# Patient Record
Sex: Female | Born: 1970 | Race: Black or African American | Hispanic: No | Marital: Single | Smoking: Former smoker
Health system: Southern US, Community
[De-identification: ages and names within clinical notes are randomized; demographics above are authoritative.]

## PROBLEM LIST (undated history)

## (undated) HISTORY — PX: TUBAL LIGATION: SHX77

---

## 2005-05-31 ENCOUNTER — Emergency Department: Payer: Self-pay | Admitting: Emergency Medicine

## 2012-08-13 ENCOUNTER — Emergency Department: Payer: Self-pay | Admitting: Emergency Medicine

## 2012-11-03 ENCOUNTER — Emergency Department: Payer: Self-pay | Admitting: Emergency Medicine

## 2012-12-26 ENCOUNTER — Emergency Department: Payer: Self-pay | Admitting: Unknown Physician Specialty

## 2013-05-09 ENCOUNTER — Emergency Department: Payer: Self-pay | Admitting: Emergency Medicine

## 2013-05-11 LAB — BETA STREP CULTURE(ARMC)

## 2013-11-25 ENCOUNTER — Emergency Department: Payer: Self-pay | Admitting: Emergency Medicine

## 2014-06-18 ENCOUNTER — Emergency Department: Payer: Self-pay | Admitting: Emergency Medicine

## 2016-01-02 DIAGNOSIS — Y990 Civilian activity done for income or pay: Secondary | ICD-10-CM | POA: Insufficient documentation

## 2016-01-02 DIAGNOSIS — X501XXA Overexertion from prolonged static or awkward postures, initial encounter: Secondary | ICD-10-CM | POA: Insufficient documentation

## 2016-01-02 DIAGNOSIS — S3992XA Unspecified injury of lower back, initial encounter: Secondary | ICD-10-CM | POA: Insufficient documentation

## 2016-01-02 DIAGNOSIS — S4991XA Unspecified injury of right shoulder and upper arm, initial encounter: Secondary | ICD-10-CM | POA: Insufficient documentation

## 2016-01-02 DIAGNOSIS — F17200 Nicotine dependence, unspecified, uncomplicated: Secondary | ICD-10-CM | POA: Insufficient documentation

## 2016-01-02 DIAGNOSIS — T148 Other injury of unspecified body region: Secondary | ICD-10-CM | POA: Insufficient documentation

## 2016-01-02 DIAGNOSIS — Y9289 Other specified places as the place of occurrence of the external cause: Secondary | ICD-10-CM | POA: Insufficient documentation

## 2016-01-02 DIAGNOSIS — S3991XA Unspecified injury of abdomen, initial encounter: Secondary | ICD-10-CM | POA: Insufficient documentation

## 2016-01-02 DIAGNOSIS — Y93F2 Activity, caregiving, lifting: Secondary | ICD-10-CM | POA: Insufficient documentation

## 2016-01-02 LAB — COMPREHENSIVE METABOLIC PANEL
ALBUMIN: 3.7 g/dL (ref 3.5–5.0)
ALK PHOS: 73 U/L (ref 38–126)
ALT: 27 U/L (ref 14–54)
ANION GAP: 6 (ref 5–15)
AST: 23 U/L (ref 15–41)
BUN: 15 mg/dL (ref 6–20)
CO2: 27 mmol/L (ref 22–32)
Calcium: 8.9 mg/dL (ref 8.9–10.3)
Chloride: 105 mmol/L (ref 101–111)
Creatinine, Ser: 1.04 mg/dL — ABNORMAL HIGH (ref 0.44–1.00)
GFR calc Af Amer: >60 mL/min (ref >60)
GFR calc non Af Amer: >60 mL/min (ref >60)
GLUCOSE: 130 mg/dL — AB (ref 65–99)
POTASSIUM: 3.6 mmol/L (ref 3.5–5.1)
SODIUM: 138 mmol/L (ref 135–145)
Total Bilirubin: 0.5 mg/dL (ref 0.3–1.2)
Total Protein: 7.6 g/dL (ref 6.5–8.1)

## 2016-01-02 LAB — URINALYSIS COMPLETE WITH MICROSCOPIC (ARMC ONLY)
BACTERIA UA: NONE SEEN
Bilirubin Urine: NEGATIVE
Glucose, UA: NEGATIVE mg/dL
Hgb urine dipstick: NEGATIVE
Nitrite: NEGATIVE
PROTEIN: NEGATIVE mg/dL
SPECIFIC GRAVITY, URINE: 1.035 — AB (ref 1.005–1.030)
pH: 5 (ref 5.0–8.0)

## 2016-01-02 LAB — CBC
HEMATOCRIT: 40.5 % (ref 35.0–47.0)
HEMOGLOBIN: 13.4 g/dL (ref 12.0–16.0)
MCH: 29.4 pg (ref 26.0–34.0)
MCHC: 33 g/dL (ref 32.0–36.0)
MCV: 89.1 fL (ref 80.0–100.0)
Platelets: 324 10*3/uL (ref 150–440)
RBC: 4.55 MIL/uL (ref 3.80–5.20)
RDW: 13.9 % (ref 11.5–14.5)
WBC: 11.7 10*3/uL — ABNORMAL HIGH (ref 3.6–11.0)

## 2016-01-02 LAB — LIPASE, BLOOD: Lipase: 22 U/L (ref 11–51)

## 2016-01-02 NOTE — ED Notes (Signed)
Pt to ED c/o right flank, RLQ pain and right arm pain X 1 days. Pt alert and oriented X4, active, cooperative, pt in NAD. RR even and unlabored, color WNL.  Denies NVD

## 2016-01-03 ENCOUNTER — Emergency Department
Admission: EM | Admit: 2016-01-03 | Discharge: 2016-01-03 | Disposition: A | Discharge status: Home / Self Care | Payer: Self-pay | Attending: Emergency Medicine | Admitting: Emergency Medicine

## 2016-01-03 ENCOUNTER — Emergency Department: Payer: Self-pay

## 2016-01-03 DIAGNOSIS — T148XXA Other injury of unspecified body region, initial encounter: Secondary | ICD-10-CM

## 2016-01-03 DIAGNOSIS — R109 Unspecified abdominal pain: Secondary | ICD-10-CM

## 2016-01-03 MED ORDER — IBUPROFEN 800 MG PO TABS: 800.0000 mg | ORAL_TABLET | Freq: Three times a day (TID) | ORAL | Status: DC | PRN

## 2016-01-03 MED ORDER — SODIUM CHLORIDE 0.9 % IV BOLUS (SEPSIS)
1000.0000 mL | Freq: Once | INTRAVENOUS | Status: AC
  Administered 2016-01-03: 1000 mL via INTRAVENOUS

## 2016-01-03 MED ORDER — KETOROLAC TROMETHAMINE 30 MG/ML IJ SOLN
10.0000 mg | Freq: Once | INTRAMUSCULAR | Status: AC
  Administered 2016-01-03: 9.9 mg via INTRAVENOUS
  Filled 2016-01-03: qty 1

## 2016-01-03 MED ORDER — HYDROCODONE-ACETAMINOPHEN 5-325 MG PO TABS: 1.0000 | ORAL_TABLET | Freq: Four times a day (QID) | ORAL | Status: DC | PRN

## 2016-01-03 NOTE — ED Provider Notes (Signed)
[facility] Emergency Department Provider Note  ____________________________________________  Time seen: Approximately 2:22 AM  I have reviewed the triage vital signs and the nursing notes.   HISTORY  Chief Complaint Abdominal Pain; Flank Pain; and Arm Pain    HPI Carolyn Baxter is a 45 y.o. female who presents with the chief complaint of right flank and right lower quadrant abdominal pain. Patient reports onset of symptoms approximately 4 days ago, waxing/waning. 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 No prior history of nephrolithiasis. Denies associated symptoms of fever, chills, nausea, vomiting, dysuria, vaginal discharge or bleeding. Denies recent travel or trauma. Nothing makes her pain better or worse.   Past medical history None  There are no active problems to display for this patient.   History reviewed. No pertinent past surgical history.  No current outpatient prescriptions on file.  Allergies Review of patient's allergies indicates no known allergies.  No family history on file.  Social History Social History  Substance Use Topics  . Smoking status: Current Every Day Smoker  . Smokeless tobacco: None  . Alcohol Use: No    Review of Systems Constitutional: No fever/chills Eyes: No visual changes. ENT: No sore throat. Cardiovascular: Denies chest pain. Respiratory: Denies shortness of breath. Gastrointestinal: Positive for abdominal pain.  No nausea, no vomiting.  No diarrhea.  No constipation. Genitourinary: Negative for dysuria. Musculoskeletal: Positive for back pain. Skin: Negative for rash. Neurological: Negative for headaches, focal weakness or numbness.  10-point ROS otherwise negative.  ____________________________________________   PHYSICAL EXAM:  VITAL SIGNS: ED Triage Vitals  Enc Vitals Group     BP 01/02/16 2254  119/84 mmHg     Pulse Rate 01/02/16 2254 60     Resp 01/02/16 2254 18     Temp 01/02/16 2254 97.6 F (36.4 C)     Temp Source 01/02/16 2254 Oral     SpO2 01/02/16 2254 100 %     Weight 01/02/16 2254 200 lb (90.719 kg)     Height 01/02/16 2254  (1.626 m)     Head Cir --      Peak Flow --      Pain Score 01/02/16 2254 7     Pain Loc --      Pain Edu? --      Excl. in GC? --     Constitutional: Alert and oriented. Well appearing and in no acute distress. Eyes: Conjunctivae are normal. PERRL. EOMI. Head: Atraumatic. Nose: No congestion/rhinnorhea. Mouth/Throat: Mucous membranes are moist.  Oropharynx non-erythematous. Neck: No stridor.   Cardiovascular: Normal rate, regular rhythm. Grossly normal heart sounds.  Good peripheral circulation. Respiratory: Normal respiratory effort.  No retractions. Lungs CTAB. Gastrointestinal: Soft and nontender. No distention. No abdominal bruits. Mild right CVA tenderness. Musculoskeletal: No lower extremity tenderness nor edema.  No joint effusions. Neurologic:  Normal speech and language. No gross focal neurologic deficits are appreciated. No gait instability. Skin:  Skin is warm, dry and intact. No rash noted. Psychiatric: Mood and affect are normal. Speech and behavior are normal.  ____________________________________________   LABS (all labs ordered are listed, but only abnormal results are displayed)  Labs Reviewed  COMPREHENSIVE METABOLIC PANEL - Abnormal; Notable for the following:    Glucose, Bld 130 (*)    Creatinine, Ser 1.04 (*)    All other components within normal limits  CBC - Abnormal; Notable for the following:  WBC 11.7 (*)    All other components within normal limits  URINALYSIS COMPLETEWITH MICROSCOPIC ([facility] ONLY) - Abnormal; Notable for the following:    Color, Urine YELLOW (*)    APPearance HAZY (*)    Ketones, ur TRACE (*)    Specific Gravity, Urine 1.035 (*)    Leukocytes, UA TRACE (*)    Squamous  Epithelial / LPF 0-5 (*)    All other components within normal limits  LIPASE, BLOOD   ____________________________________________  EKG  ED ECG REPORT I, SUNG,JADE J, the attending physician, personally viewed and interpreted this ECG.   Date: 01/03/2016  EKG Time: 2257  Rate: 62  Rhythm: normal EKG, normal sinus rhythm  Axis: Normal  Intervals:none  ST&T Change: Nonspecific  ____________________________________________  RADIOLOGY  CT renal stone study interpreted per Dr. Andria Meuse: No renal or ureteral stone or obstruction. No acute process demonstrated in the abdomen or pelvis on noncontrast imaging. ____________________________________________   PROCEDURES  Procedure(s) performed: None  Critical Care performed: No  ____________________________________________   INITIAL IMPRESSION / ASSESSMENT AND PLAN / ED COURSE  Pertinent labs & imaging results that were available during my care of the patient were reviewed by me and considered in my medical decision making (see chart for details).  45 year old female who presents with right flank to right lower quadrant abdominal pain. Laboratory and urinalysis results are remarkable except for 0-5 RBC noted in urine. Will initiate IV fluid resuscitation and obtain CT renal colic study to evaluate for nephrolithiasis.  ----------------------------------------- 3:24 AM on 01/03/2016 -----------------------------------------  Patient resting in no acute distress. Updated patient of CT imaging results. She drove herself to the ED so we will administer a dose of Toradol prior to discharge. Symptoms are likely musculoskeletal in nature given patient's line of work. Strict return precautions given. Patient verbalizes understanding and agrees with plan of care.  ____________________________________________   FINAL CLINICAL IMPRESSION(S) / ED DIAGNOSES  Final diagnoses:  Right flank pain  Musculoskeletal strain      Irean Hong, MD 01/03/16 [phone]

## 2016-01-03 NOTE — Discharge Instructions (Signed)
1.  Take pain medicines as needed (Motrin/Norco #15). °2.  Return to the ER for worsening symptoms, persistent vomiting, difficulty breathing or other concerns. °

## 2016-01-03 NOTE — ED Notes (Signed)
Patient transported to CT 

## 2016-09-27 ENCOUNTER — Emergency Department
Admission: EM | Admit: 2016-09-27 | Discharge: 2016-09-27 | Disposition: A | Discharge status: Home / Self Care | Payer: Self-pay | Attending: Emergency Medicine | Admitting: Emergency Medicine

## 2016-09-27 DIAGNOSIS — G43809 Other migraine, not intractable, without status migrainosus: Secondary | ICD-10-CM | POA: Insufficient documentation

## 2016-09-27 DIAGNOSIS — J0110 Acute frontal sinusitis, unspecified: Secondary | ICD-10-CM | POA: Insufficient documentation

## 2016-09-27 DIAGNOSIS — Z79899 Other long term (current) drug therapy: Secondary | ICD-10-CM | POA: Insufficient documentation

## 2016-09-27 DIAGNOSIS — F17200 Nicotine dependence, unspecified, uncomplicated: Secondary | ICD-10-CM | POA: Insufficient documentation

## 2016-09-27 LAB — CBC
HEMATOCRIT: 43.3 % (ref 35.0–47.0)
HEMOGLOBIN: 14.1 g/dL (ref 12.0–16.0)
MCH: 29.3 pg (ref 26.0–34.0)
MCHC: 32.7 g/dL (ref 32.0–36.0)
MCV: 89.7 fL (ref 80.0–100.0)
Platelets: 370 10*3/uL (ref 150–440)
RBC: 4.83 MIL/uL (ref 3.80–5.20)
RDW: 14.3 % (ref 11.5–14.5)
WBC: 11.6 10*3/uL — AB (ref 3.6–11.0)

## 2016-09-27 LAB — BASIC METABOLIC PANEL
ANION GAP: 4 — AB (ref 5–15)
BUN: 13 mg/dL (ref 6–20)
CHLORIDE: 108 mmol/L (ref 101–111)
CO2: 26 mmol/L (ref 22–32)
CREATININE: 1.12 mg/dL — AB (ref 0.44–1.00)
Calcium: 8.4 mg/dL — ABNORMAL LOW (ref 8.9–10.3)
GFR calc non Af Amer: 58 mL/min — ABNORMAL LOW (ref >60)
Glucose, Bld: 124 mg/dL — ABNORMAL HIGH (ref 65–99)
POTASSIUM: 4.5 mmol/L (ref 3.5–5.1)
SODIUM: 138 mmol/L (ref 135–145)

## 2016-09-27 MED ORDER — SODIUM CHLORIDE 0.9 % IV BOLUS (SEPSIS)
1000.0000 mL | Freq: Once | INTRAVENOUS | Status: AC
  Administered 2016-09-27: 1000 mL via INTRAVENOUS

## 2016-09-27 MED ORDER — AMOXICILLIN 500 MG PO CAPS: 500.0000 mg | ORAL_CAPSULE | Freq: Three times a day (TID) | ORAL | 0 refills | Status: AC

## 2016-09-27 MED ORDER — KETOROLAC TROMETHAMINE 30 MG/ML IJ SOLN
30.0000 mg | Freq: Once | INTRAMUSCULAR | Status: AC
  Administered 2016-09-27: 30 mg via INTRAVENOUS
  Filled 2016-09-27: qty 1

## 2016-09-27 MED ORDER — METOCLOPRAMIDE HCL 5 MG/ML IJ SOLN
10.0000 mg | Freq: Once | INTRAMUSCULAR | Status: AC
  Administered 2016-09-27: 10 mg via INTRAVENOUS
  Filled 2016-09-27: qty 2

## 2016-09-27 MED ORDER — AMOXICILLIN 500 MG PO CAPS
500.0000 mg | ORAL_CAPSULE | Freq: Once | ORAL | Status: AC
  Administered 2016-09-27: 500 mg via ORAL
  Filled 2016-09-27: qty 1

## 2016-09-27 MED ORDER — DIPHENHYDRAMINE HCL 50 MG/ML IJ SOLN
50.0000 mg | Freq: Once | INTRAMUSCULAR | Status: AC
  Administered 2016-09-27: 50 mg via INTRAVENOUS
  Filled 2016-09-27: qty 1

## 2016-09-27 MED ORDER — IBUPROFEN 800 MG PO TABS: 800.0000 mg | ORAL_TABLET | Freq: Three times a day (TID) | ORAL | 0 refills | Status: DC | PRN

## 2016-09-27 MED ORDER — ONDANSETRON HCL 4 MG/2ML IJ SOLN: 4.0000 mg | Freq: Once | INTRAMUSCULAR | Status: DC | PRN

## 2016-09-27 MED ORDER — OXYMETAZOLINE HCL 0.05 % NA SOLN: 2.0000 | Freq: Two times a day (BID) | NASAL | 0 refills | Status: AC

## 2016-09-27 NOTE — ED Provider Notes (Signed)
[facility] Emergency Department Provider Note  ____________________________________________  Time seen: Approximately 9:58 PM  I have reviewed the triage vital signs and the nursing notes.   HISTORY  Chief Complaint Emesis and Headache   HPI Carolyn Baxter is a 45 y.o. female a history of migraine headaches who presents for evaluation of a headache. Patient reports 2 days of congestion and frontal headache. She describes her headache as throbbing, initially mild that has progressed to severe over the course of the last 2 days. The headache is currently 10 out of 10. She also reports that she started having nausea and vomiting and blurry vision today. She reports that these symptoms are similar to prior episode of sinusitis. Patient reports used to have a history of migraines when she was younger however hasn't had one in a very long time. She endorses phonophobia and photophobia. Patient denies weakness or numbness, sudden onset HA or HA with maximal intensity at onset, fever, neck stiffness, history of immunosuppression, Jaw claudication, muscle aches, temporal artery pain, history of other household members with similar symptoms, pregnancy, clotting disorder, trauma, eye pain, recent cervical manipulation. No personal or family history of aneurysms, blood clots, patient is not on any exogenous hormones.   History reviewed. No pertinent past medical history.  There are no active problems to display for this patient.   Past Surgical History:  Procedure Laterality Date  . TUBAL LIGATION      Prior to Admission medications   Medication Sig Start Date End Date Taking? Authorizing Provider  amoxicillin (AMOXIL) 500 MG capsule Take 1 capsule (500 mg total) by mouth 3 (three) times daily. 09/27/16 10/04/16  Nita Sicklearolina Martine Bleecker, MD  HYDROcodone-acetaminophen (NORCO) 5-325 MG tablet Take 1 tablet by mouth every 6 (six) hours as needed for moderate pain. 01/03/16    Irean HongJade J Sung, MD  ibuprofen (ADVIL,MOTRIN) 800 MG tablet Take 1 tablet (800 mg total) by mouth every 8 (eight) hours as needed. 09/27/16   Nita Sicklearolina Wolf Boulay, MD  oxymetazoline (AFRIN) 0.05 % nasal spray Place 2 sprays into both nostrils 2 (two) times daily. 09/27/16 09/27/17  Nita Sicklearolina Calan Doren, MD    Allergies Patient has no known allergies.  No family history on file.  Social History Social History  Substance Use Topics  . Smoking status: Current Every Day Smoker  . Smokeless tobacco: Never Used  . Alcohol use No    Review of Systems  Constitutional: Negative for fever. Eyes: Negative for visual changes. ENT: Negative for sore throat. + congestion and frontal HA Cardiovascular: Negative for chest pain. Respiratory: Negative for shortness of breath. Gastrointestinal: Negative for abdominal pain,diarrhea. + N/V Genitourinary: Negative for dysuria. Musculoskeletal: Negative for back pain. Skin: Negative for rash. Neurological: Negative for weakness or numbness.  ____________________________________________   PHYSICAL EXAM:  VITAL SIGNS: ED Triage Vitals  Enc Vitals Group     BP 09/27/16 2119 133/84     Pulse Rate 09/27/16 2119 69     Resp 09/27/16 2119 20     Temp 09/27/16 2119 98.2 F (36.8 C)     Temp Source 09/27/16 2119 Oral     SpO2 09/27/16 2119 98 %     Weight 09/27/16 2119 190 lb (86.2 kg)     Height 09/27/16 2119 5\' 4"  (1.626 m)     Head Circumference --      Peak Flow --      Pain Score 09/27/16 2120 10     Pain Loc --  Pain Edu? --      Excl. in GC? --     Constitutional: Alert and oriented. Well appearing and in no apparent distress. HEENT:      Head: Normocephalic and atraumatic. TTP over the R frontal sinus        Eyes: Conjunctivae are normal. Sclera is non-icteric. EOMI. PERRL      Mouth/Throat: Mucous membranes are moist.       Neck: Supple with no signs of meningismus. Negative jolting accentuating maneuver Cardiovascular: Regular rate  and rhythm. No murmurs, gallops, or rubs. 2+ symmetrical distal pulses are present in all extremities. No JVD. Respiratory: Normal respiratory effort. Lungs are clear to auscultation bilaterally. No wheezes, crackles, or rhonchi.  Gastrointestinal: Soft, non tender, and non distended with positive bowel sounds. No rebound or guarding. Musculoskeletal: Nontender with normal range of motion in all extremities. No edema, cyanosis, or erythema of extremities. Neurologic: Normal speech and language. A & O x3, PERRL, no nystagmus, CN II-XII intact, motor testing reveals good tone and bulk throughout. There is no evidence of pronator drift or dysmetria. Muscle strength is 5/5 throughout. Deep tendon reflexes are 2+ throughout with downgoing toes. Sensory examination is intact. Gait is normal. Skin: No rash or petechiae Psychiatric: Mood and affect are normal. Speech and behavior are normal.  ____________________________________________   LABS (all labs ordered are listed, but only abnormal results are displayed)  Labs Reviewed  CBC - Abnormal; Notable for the following:       Result Value   WBC 11.6 (*)    All other components within normal limits  BASIC METABOLIC PANEL - Abnormal; Notable for the following:    Glucose, Bld 124 (*)    Creatinine, Ser 1.12 (*)    Calcium 8.4 (*)    GFR calc non Af Amer 58 (*)    Anion gap 4 (*)    All other components within normal limits  URINALYSIS COMPLETEWITH MICROSCOPIC ([facility] ONLY)   ____________________________________________  EKG  ED ECG REPORT I, Nita Sicklearolina Trinidy Masterson, the attending physician, personally viewed and interpreted this ECG.  Normal sinus rhythm, rate of 69, normal intervals, normal axis, no ST elevations or depressions. Normal EKG ____________________________________________  RADIOLOGY  none ____________________________________________   PROCEDURES  Procedure(s) performed: None Procedures Critical Care performed:   None ____________________________________________   INITIAL IMPRESSION / ASSESSMENT AND PLAN / ED COURSE  45 year old female with remote history of migraine headaches who presents for evaluation of frontal throbbing progressively worsening headache over the course of the last 2 days associated with congestion, tenderness to palpation of the frontal sinus, photophobia, and phonophobia. Presentation concerning for sinusitis versus migraines. Patient is completely neurologically intact. Low suspicion for more serious or life threatening etiology of HA based on history and exam. No sudden onset thunderclap HA, onset with exertion, vomiting, focal neurologic deficits, to suggest increased risk of subarachnoid hemorrhage. No fever, neck pain, neck stiffness, or meningismus on exam to suggest meningitis. No fevers, altered mental status, unusual behavior to suggest encephalitis. No focal neurologic deficits by history or exam to suggest central venous thrombosis. No constitutional symptoms including fever, fatigue, weight loss, temporal scalp tenderness, jaw claudication, visual loss, to suggest temporal arteritis. No immunocompromise to suggest increased risk for intracranial infectious disease. No visual changes or findings on ocular exam to suggest acute angle closure glaucoma. No reports of toxic exposures including carbon monoxide or other household members with similar symptoms.  Plan for IV fluids, IV Benadryl, IV Reglan, IV Toradol. We'll check  pregnancy test. Will aslo start patient on amoxicillin for sinusitis.   Clinical Course as of Sep 27 2317  Wynelle Link Sep 27, 2016  2316 Patient's headache has fully resolved. She remains neurologically intact with normal exam. We'll discharge home with supportive care and amoxicillin for sinusitis.  [CV]    Clinical Course User Index [CV] Nita Sickle, MD    Pertinent labs & imaging results that were available during my care of the patient were reviewed by  me and considered in my medical decision making (see chart for details).    ____________________________________________   FINAL CLINICAL IMPRESSION(S) / ED DIAGNOSES  Final diagnoses:  Other migraine without status migrainosus, not intractable  Acute frontal sinusitis, recurrence not specified      NEW MEDICATIONS STARTED DURING THIS VISIT:  New Prescriptions   AMOXICILLIN (AMOXIL) 500 MG CAPSULE    Take 1 capsule (500 mg total) by mouth 3 (three) times daily.   IBUPROFEN (ADVIL,MOTRIN) 800 MG TABLET    Take 1 tablet (800 mg total) by mouth every 8 (eight) hours as needed.   OXYMETAZOLINE (AFRIN) 0.05 % NASAL SPRAY    Place 2 sprays into both nostrils 2 (two) times daily.     Note:  This document was prepared using Dragon voice recognition software and may include unintentional dictation errors.    Nita Sickle, MD 09/27/16 [phone]

## 2016-09-27 NOTE — ED Notes (Signed)
Pt discharged to home.  Family member driving.  Discharge instructions reviewed.  Verbalized understanding.  No questions or concerns at this time.  Teach back verified.  Pt in NAD.  No items left in ED.   

## 2016-09-27 NOTE — ED Triage Notes (Signed)
Pt has had headache, dizziness and some blurred vision x 2 days.  Pt also reports one episode of vomiting approx 1 hour prior to arrival.  Pt states that the last time she had these symptoms she had norovirus.  Pt states she took an excedrin this morning with no relief.

## 2016-11-23 DIAGNOSIS — J40 Bronchitis, not specified as acute or chronic: Secondary | ICD-10-CM | POA: Insufficient documentation

## 2016-11-23 DIAGNOSIS — F17200 Nicotine dependence, unspecified, uncomplicated: Secondary | ICD-10-CM | POA: Insufficient documentation

## 2016-11-24 ENCOUNTER — Encounter: Payer: Self-pay | Admitting: Emergency Medicine

## 2016-11-24 ENCOUNTER — Emergency Department
Admission: EM | Admit: 2016-11-24 | Discharge: 2016-11-24 | Disposition: A | Discharge status: Home / Self Care | Payer: Self-pay | Attending: Emergency Medicine | Admitting: Emergency Medicine

## 2016-11-24 ENCOUNTER — Emergency Department: Payer: Self-pay

## 2016-11-24 DIAGNOSIS — J40 Bronchitis, not specified as acute or chronic: Secondary | ICD-10-CM

## 2016-11-24 DIAGNOSIS — R05 Cough: Secondary | ICD-10-CM

## 2016-11-24 DIAGNOSIS — R059 Cough, unspecified: Secondary | ICD-10-CM

## 2016-11-24 LAB — BASIC METABOLIC PANEL
Anion gap: 6 (ref 5–15)
BUN: 7 mg/dL (ref 6–20)
CALCIUM: 8.5 mg/dL — AB (ref 8.9–10.3)
CO2: 24 mmol/L (ref 22–32)
CREATININE: 0.89 mg/dL (ref 0.44–1.00)
Chloride: 107 mmol/L (ref 101–111)
Glucose, Bld: 102 mg/dL — ABNORMAL HIGH (ref 65–99)
Potassium: 3.7 mmol/L (ref 3.5–5.1)
SODIUM: 137 mmol/L (ref 135–145)

## 2016-11-24 LAB — CBC
HCT: 40.5 % (ref 35.0–47.0)
HEMOGLOBIN: 13.5 g/dL (ref 12.0–16.0)
MCH: 30.1 pg (ref 26.0–34.0)
MCHC: 33.4 g/dL (ref 32.0–36.0)
MCV: 90.1 fL (ref 80.0–100.0)
PLATELETS: 264 10*3/uL (ref 150–440)
RBC: 4.5 MIL/uL (ref 3.80–5.20)
RDW: 13.9 % (ref 11.5–14.5)
WBC: 6 10*3/uL (ref 3.6–11.0)

## 2016-11-24 LAB — LIPASE, BLOOD: LIPASE: 20 U/L (ref 11–51)

## 2016-11-24 LAB — TROPONIN I

## 2016-11-24 MED ORDER — KETOROLAC TROMETHAMINE 60 MG/2ML IM SOLN
60.0000 mg | Freq: Once | INTRAMUSCULAR | Status: AC
  Administered 2016-11-24: 60 mg via INTRAMUSCULAR
  Filled 2016-11-24: qty 2

## 2016-11-24 MED ORDER — PREDNISONE 20 MG PO TABS: 60.0000 mg | ORAL_TABLET | Freq: Every day | ORAL | 0 refills | Status: DC

## 2016-11-24 MED ORDER — BENZONATATE 100 MG PO CAPS: 100.0000 mg | ORAL_CAPSULE | Freq: Four times a day (QID) | ORAL | 0 refills | Status: DC | PRN

## 2016-11-24 MED ORDER — BENZONATATE 100 MG PO CAPS
100.0000 mg | ORAL_CAPSULE | Freq: Once | ORAL | Status: AC
  Administered 2016-11-24: 100 mg via ORAL
  Filled 2016-11-24: qty 1

## 2016-11-24 NOTE — ED Triage Notes (Signed)
Patient ambulatory to triage with steady gait, without difficulty or distress noted; pt reports nonprod cough x 2 days with no accomp symptoms; taking OTC meds without relief

## 2016-11-24 NOTE — ED Provider Notes (Signed)
[facility] Emergency Department Provider Note   ____________________________________________   First MD Initiated Contact with Patient 11/24/16 0451     (approximate)  I have reviewed the triage vital signs and the nursing notes.   HISTORY  Chief Complaint Cough    HPI Carolyn Baxter is a 46 y.o. female who comes into the hospital today with a bad cough and running a fever. The patient reports that she's been coughing so much that she's been urinating on herself. She has some chest pain and soreness in her arms and joints. The patient also reports that she's coughed so much that she has a headache. The patient reports that this started around January 6 and she has been taking TheraFlu and Tylenol and Aleve. She has had some sick contacts at work. The patient reports that she had a temperature at home to a max of 101. She reports that she's been sleeping a lot at home as well. She denies any nausea or vomiting but has some 10 out of 10 pain she reports in her chest as well as down her back worse when she is coughing. The patient decided to come into the hospital today for evaluation.   History reviewed. No pertinent past medical history.  There are no active problems to display for this patient.   Past Surgical History:  Procedure Laterality Date  . TUBAL LIGATION      Prior to Admission medications   Medication Sig Start Date End Date Taking? Authorizing Provider  benzonatate (TESSALON PERLES) 100 MG capsule Take 1 capsule (100 mg total) by mouth every 6 (six) hours as needed for cough. 11/24/16   Rebecka Apley, MD  HYDROcodone-acetaminophen (NORCO) 5-325 MG tablet Take 1 tablet by mouth every 6 (six) hours as needed for moderate pain. 01/03/16   Irean Hong, MD  ibuprofen (ADVIL,MOTRIN) 800 MG tablet Take 1 tablet (800 mg total) by mouth every 8 (eight) hours as needed. 09/27/16   Nita Sickle, MD  oxymetazoline (AFRIN) 0.05 % nasal spray Place  2 sprays into both nostrils 2 (two) times daily. 09/27/16 09/27/17  Nita Sickle, MD  predniSONE (DELTASONE) 20 MG tablet Take 3 tablets (60 mg total) by mouth daily. 11/24/16   Rebecka Apley, MD    Allergies Patient has no known allergies.  No family history on file.  Social History Social History  Substance Use Topics  . Smoking status: Current Every Day Smoker  . Smokeless tobacco: Never Used  . Alcohol use No    Review of Systems Constitutional:  fever/chills Eyes: No visual changes. ENT: No sore throat. Cardiovascular:  chest pain. Respiratory: Cough Gastrointestinal: No abdominal pain.  No nausea, no vomiting.  No diarrhea.  No constipation. Genitourinary: Negative for dysuria. Musculoskeletal: Negative for back pain. Skin: Negative for rash. Neurological: Headache.  10-point ROS otherwise negative.  ____________________________________________   PHYSICAL EXAM:  VITAL SIGNS: ED Triage Vitals  Enc Vitals Group     BP 11/24/16 0001 139/79     Pulse Rate 11/24/16 0001 92     Resp 11/24/16 0001 16     Temp 11/24/16 0001 99.6 F (37.6 C)     Temp Source 11/24/16 0001 Oral     SpO2 11/24/16 0001 93 %     Weight 11/24/16 0002 170 lb (77.1 kg)     Height 11/24/16 0002 5\' 3"  (1.6 m)     Head Circumference --      Peak Flow --  Pain Score 11/24/16 0514 8     Pain Loc --      Pain Edu? --      Excl. in GC? --     Constitutional: Alert and oriented. Well appearing and in Mild distress. Eyes: Conjunctivae are normal. PERRL. EOMI. Head: Atraumatic. Nose: No congestion/rhinnorhea. Mouth/Throat: Mucous membranes are moist.  Oropharynx non-erythematous. Cardiovascular: Normal rate, regular rhythm. Grossly normal heart sounds.  Good peripheral circulation. Respiratory: Normal respiratory effort.  No retractions. Lungs CTAB. Gastrointestinal: Soft and nontender. No distention. Positive bowel sounds Musculoskeletal: No lower extremity tenderness nor edema.   Neurologic:  Normal speech and language.  Skin:  Skin is warm, dry and intact. Marland Kitchen Psychiatric: Mood and affect are normal.   ____________________________________________   LABS (all labs ordered are listed, but only abnormal results are displayed)  Labs Reviewed  BASIC METABOLIC PANEL - Abnormal; Notable for the following:       Result Value   Glucose, Bld 102 (*)    Calcium 8.5 (*)    All other components within normal limits  CBC  TROPONIN I  LIPASE, BLOOD   ____________________________________________  EKG  ED ECG REPORT I, Rebecka Apley, the attending physician, personally viewed and interpreted this ECG.   Date: 11/24/2016  EKG Time: 622  Rate: 80  Rhythm: normal sinus rhythm  Axis: normal  Intervals:none  ST&T Change: flipped t waves in lead III, avf, v3 slightly worse than previous  ____________________________________________  RADIOLOGY  CXR ____________________________________________   PROCEDURES  Procedure(s) performed: None  Procedures  Critical Care performed: No  ____________________________________________   INITIAL IMPRESSION / ASSESSMENT AND PLAN / ED COURSE  Pertinent labs & imaging results that were available during my care of the patient were reviewed by me and considered in my medical decision making (see chart for details).  This is a 46 year old female who comes into the hospital today with some cough, chest pain and body aches. The patient reports that she did have a fever at home. I did check some blood work included troponin and a lipase. We also did chest x-ray. All of the patient's results are unremarkable. She does not have any increased work of breathing with this she have any wheezing. I will give the patient a dose of benzonatate and a shot of Toradol in the ER. As the patient's workup is unremarkable and feels the patient can be discharged home. She will be discharged to follow-up with the acute care clinic. The patient  has no further complaints or concerns at this time.  Clinical Course as of Nov 24 842  Tue Nov 24, 2016  0613 Clear lungs DG Chest 2 View [AW]    Clinical Course User Index [AW] Rebecka Apley, MD     ____________________________________________   FINAL CLINICAL IMPRESSION(S) / ED DIAGNOSES  Final diagnoses:  Cough  Bronchitis      NEW MEDICATIONS STARTED DURING THIS VISIT:  Discharge Medication List as of 11/24/2016  6:15 AM    START taking these medications   Details  benzonatate (TESSALON PERLES) 100 MG capsule Take 1 capsule (100 mg total) by mouth every 6 (six) hours as needed for cough., Starting Tue 11/24/2016, Print    predniSONE (DELTASONE) 20 MG tablet Take 3 tablets (60 mg total) by mouth daily., Starting Tue 11/24/2016, Print         Note:  This document was prepared using Dragon voice recognition software and may include unintentional dictation errors.    Revonda Standard P  Zenda AlpersWebster, MD 11/24/16 [phone]

## 2017-02-03 IMAGING — CR DG CHEST 2V
2 series · 2 of 2 positions shown · non-contrast
Comparison: None.

CLINICAL DATA: Cough

EXAM:
CHEST  2 VIEW

[chest pa]
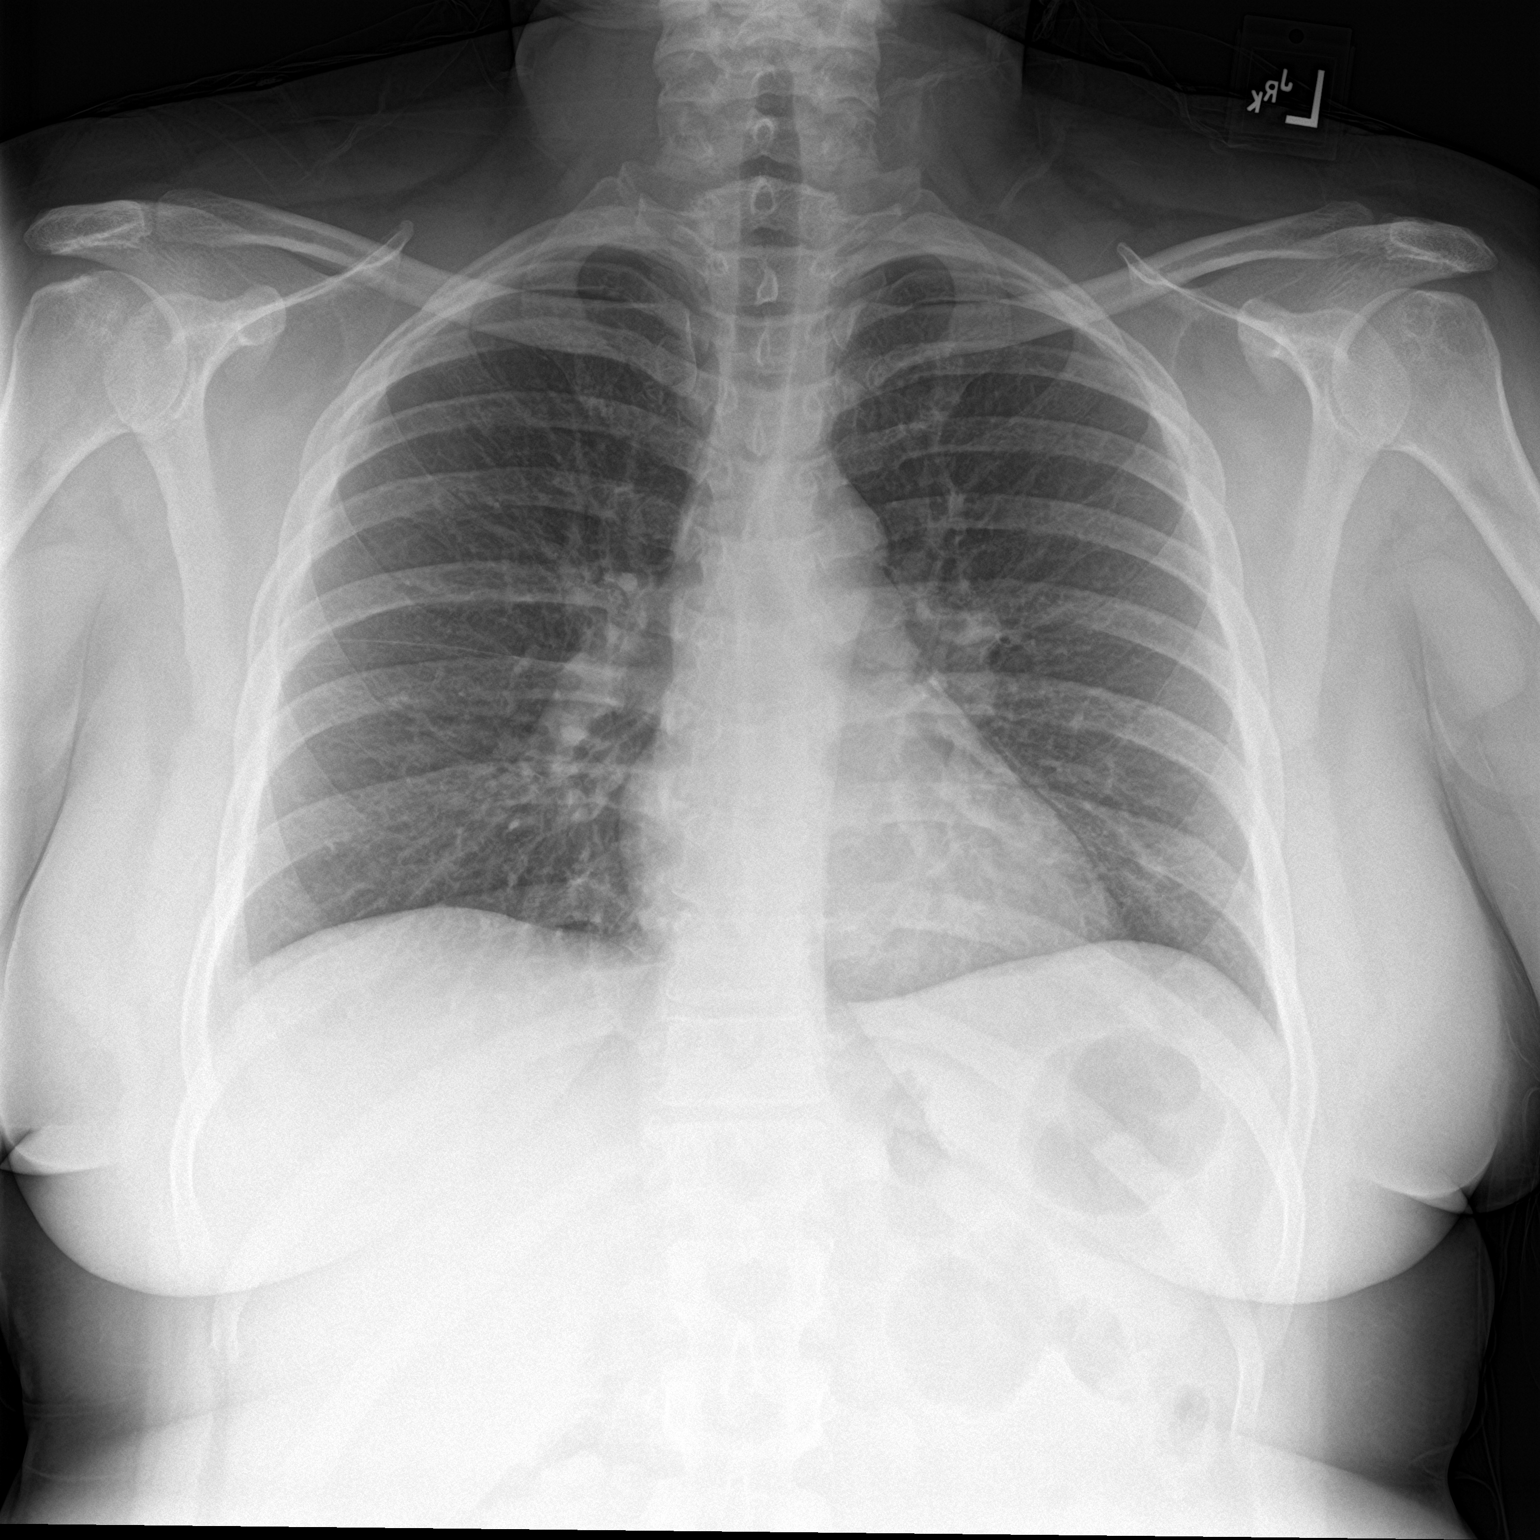

[chest lat]
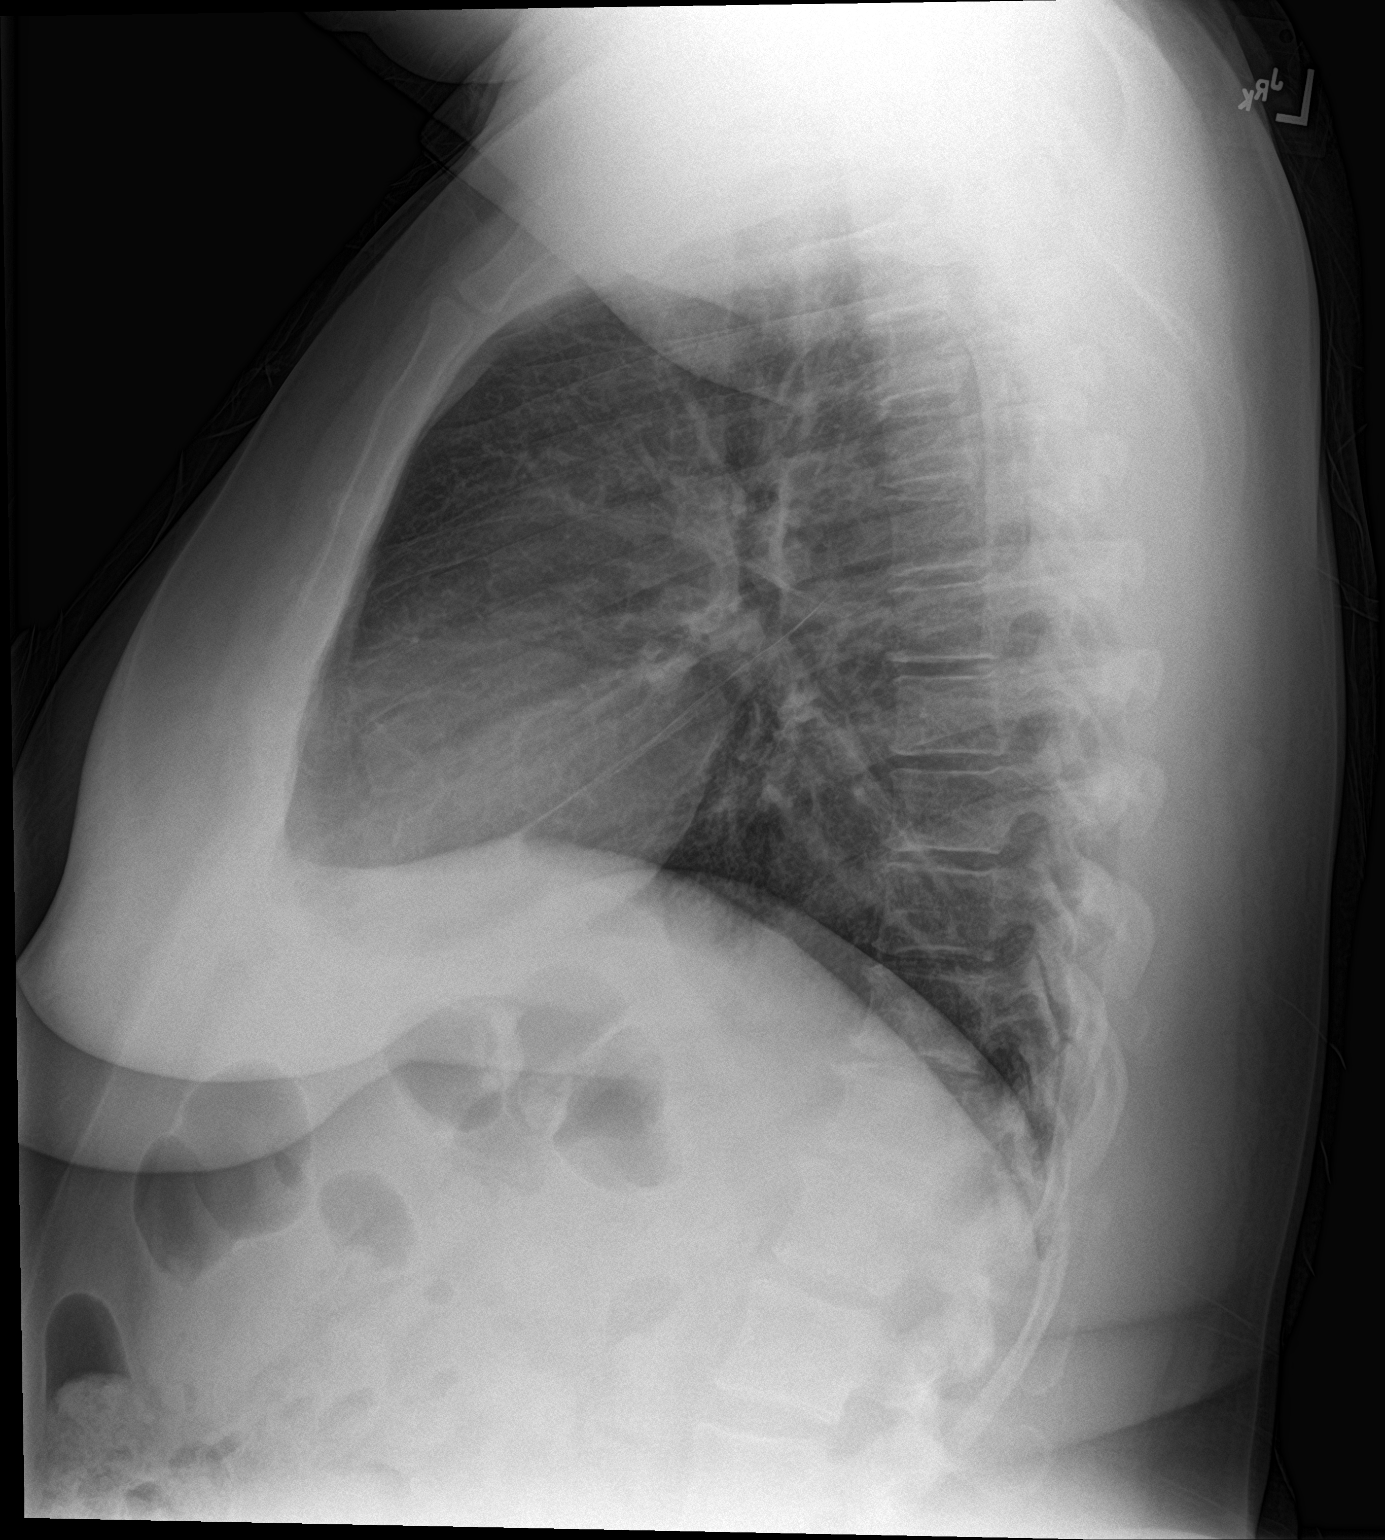

[2 of 2 positions shown; findings below may reference images not displayed]

FINDINGS: Cardiomediastinal contours are normal. No pneumothorax or pleural
effusion. No focal airspace consolidation or pulmonary edema.
IMPRESSION: Clear lungs.

## 2021-03-10 ENCOUNTER — Other Ambulatory Visit: Payer: Self-pay

## 2021-03-10 ENCOUNTER — Emergency Department
Admission: EM | Admit: 2021-03-10 | Discharge: 2021-03-10 | Disposition: A | Discharge status: Home / Self Care | Payer: Self-pay | Attending: Emergency Medicine | Admitting: Emergency Medicine

## 2021-03-10 DIAGNOSIS — F17200 Nicotine dependence, unspecified, uncomplicated: Secondary | ICD-10-CM | POA: Insufficient documentation

## 2021-03-10 DIAGNOSIS — M7542 Impingement syndrome of left shoulder: Secondary | ICD-10-CM | POA: Insufficient documentation

## 2021-03-10 MED ORDER — ACETAMINOPHEN 325 MG PO TABS
650.0000 mg | ORAL_TABLET | Freq: Once | ORAL | Status: AC
  Administered 2021-03-10: 650 mg via ORAL
  Filled 2021-03-10: qty 2

## 2021-03-10 MED ORDER — METHOCARBAMOL 500 MG PO TABS
750.0000 mg | ORAL_TABLET | Freq: Once | ORAL | Status: AC
  Administered 2021-03-10: 750 mg via ORAL
  Filled 2021-03-10: qty 2

## 2021-03-10 MED ORDER — PREDNISONE 10 MG PO TABS: ORAL_TABLET | ORAL | 0 refills | Status: AC

## 2021-03-10 MED ORDER — METHOCARBAMOL 750 MG PO TABS: 750.0000 mg | ORAL_TABLET | Freq: Four times a day (QID) | ORAL | 0 refills | Status: AC | PRN

## 2021-03-10 MED ORDER — PREDNISONE 20 MG PO TABS
60.0000 mg | ORAL_TABLET | Freq: Once | ORAL | Status: AC
  Administered 2021-03-10: 60 mg via ORAL
  Filled 2021-03-10: qty 3

## 2021-03-10 NOTE — ED Triage Notes (Signed)
Pt comes with c/o left shoulder pain that radiates to back and neck. Pt states no recent injuries. Pt states sometimes it will run through her risk.

## 2021-03-10 NOTE — ED Notes (Signed)
Provided Discharge instructions. Verbalized understanding.   

## 2021-03-10 NOTE — ED Provider Notes (Signed)
[facility] Emergency Department Provider Note  ____________________________________________   Event Date/Time   First MD Initiated Contact with Patient 03/10/21 1828     (approximate)  I have reviewed the triage vital signs and the nursing notes.   HISTORY  Chief Complaint Shoulder Pain   HPI Carolyn Baxter is a 50 y.o. female who reports to the emergency department for evaluation of left shoulder pain that is radiating from the neck down the arm.  Patient states that she moved 2 weeks ago and was lifting some heavy boxes and furniture and noticed mild pain in the shoulder that time but she had acute worsening that began on Friday.  She denies taking any medications prior to arrival.  She reports the pain is worse with any use or movement of the shoulder.  She denies associated chest pain, shortness of breath.  Denies history of similar.         There are no problems to display for this patient.   Past Surgical History:  Procedure Laterality Date  . TUBAL LIGATION      Prior to Admission medications   Medication Sig Start Date End Date Taking? Authorizing Provider  methocarbamol (ROBAXIN-750) 750 MG tablet Take 1 tablet (750 mg total) by mouth 4 (four) times daily as needed for up to 10 days for muscle spasms. 03/10/21 03/20/21 Yes Shawntavia Saunders, Ruben Gottron, PA  predniSONE (DELTASONE) 10 MG tablet Take 6 tablets (60 mg total) by mouth daily for 1 day, THEN 5 tablets (50 mg total) daily for 1 day, THEN 4 tablets (40 mg total) daily for 1 day, THEN 3 tablets (30 mg total) daily for 1 day, THEN 2 tablets (20 mg total) daily for 1 day, THEN 1 tablet (10 mg total) daily for 1 day. 03/10/21 03/16/21 Yes Lucy Chris, PA    Allergies Patient has no known allergies.  No family history on file.  Social History Social History   Tobacco Use  . Smoking status: Current Every Day Smoker  . Smokeless tobacco: Never Used  Substance Use Topics  . Alcohol use: No     Review of Systems  Constitutional: No fever/chills Eyes: No visual changes. ENT: No sore throat. Cardiovascular: Denies chest pain. Respiratory: Denies shortness of breath. Gastrointestinal: No abdominal pain.  No nausea, no vomiting.  No diarrhea.  No constipation. Genitourinary: Negative for dysuria. Musculoskeletal:+ Left shoulder pain,+ neck pain, negative for back pain. Skin: Negative for rash. Neurological: Negative for headaches, focal weakness or numbness.   ____________________________________________   PHYSICAL EXAM:  VITAL SIGNS: ED Triage Vitals  Enc Vitals Group     BP 03/10/21 1824 (!) 148/94     Pulse Rate 03/10/21 1824 80     Resp 03/10/21 1824 18     Temp 03/10/21 1824 97.7 F (36.5 C)     Temp src --      SpO2 03/10/21 1824 97 %     Weight 03/10/21 1835 169 lb 15.6 oz (77.1 kg)     Height 03/10/21 1835 5\' 3"  (1.6 m)     Head Circumference --      Peak Flow --      Pain Score 03/10/21 1823 8     Pain Loc --      Pain Edu? --      Excl. in [place]? --    Constitutional: Alert and oriented. Well appearing and in no acute distress. Eyes: Conjunctivae are normal. PERRL. EOMI. Head: Atraumatic. Nose: No congestion/rhinnorhea.  Mouth/Throat: Mucous membranes are moist. Neck: No stridor.  There is no tenderness to palpation of the midline or right paraspinal musculature.  There is tenderness to palpate left paraspinal musculature into the left upper trapezius. Cardiovascular: No reproducible tenderness to the chest wall.  Normal rate, regular rhythm. Grossly normal heart sounds.  Good peripheral circulation. Respiratory: Normal respiratory effort.  No retractions. Lungs CTAB. Gastrointestinal: Soft and nontender. No distention. No abdominal bruits. No CVA tenderness. Musculoskeletal: There is tenderness with any manipulation of the left shoulder, worse in flexion and abduction above 90 degrees.  Patient has positive impingement signs with Hawkins and empty  can.  When at 0 degrees of shoulder flexion or abduction, the patient has 5/5 strength in internal and external rotation, initiation of flexion and abduction.  Radial pulse 2+ bilaterally, capillary refill less than 3 seconds. Neurologic:  Normal speech and language. No gross focal neurologic deficits are appreciated. No gait instability. Skin:  Skin is warm, dry and intact. No rash noted. Psychiatric: Mood and affect are normal. Speech and behavior are normal.  ____________________________________________   INITIAL IMPRESSION / ASSESSMENT AND PLAN / ED COURSE  As part of my medical decision making, I reviewed the following data within the electronic MEDICAL RECORD NUMBER Nursing notes reviewed and incorporated and Notes from prior ED visits        Patient is a 50 year old female who presents to the emergency department for evaluation of left shoulder pain with radiation down the left arm.  See HPI for further details.  This began short while after moving heavy items and recently worsened the last few days.  On physical exam, the patient has reproducible pain to palpation of the left paraspinals of the cervical spine into the upper trapezius as well as painful manipulation of the left shoulder, worse with impingement maneuvers.  Suspect component of cervical radiculopathy versus left shoulder impingement as cause of the patient's symptoms.  Will initiate course of steroid taper as well as muscle relaxer and recommend Tylenol 4 times daily.  We will have the patient follow-up with orthopedics if no improvement in symptoms.  Patient is amenable with this plan, she stable at this time for outpatient management, return precautions were discussed.      ____________________________________________   FINAL CLINICAL IMPRESSION(S) / ED DIAGNOSES  Final diagnoses:  Impingement syndrome of left shoulder     ED Discharge Orders         Ordered    methocarbamol (ROBAXIN-750) 750 MG tablet  4 times  daily PRN        03/10/21 1849    predniSONE (DELTASONE) 10 MG tablet        03/10/21 1849          *Please note:  Tucker Steedley Barresi was evaluated in Emergency Department on 03/11/2021 for the symptoms described in the history of present illness. She was evaluated in the context of the global COVID-19 pandemic, which necessitated consideration that the patient might be at risk for infection with the SARS-CoV-2 virus that causes COVID-19. Institutional protocols and algorithms that pertain to the evaluation of patients at risk for COVID-19 are in a state of rapid change based on information released by regulatory bodies including the CDC and federal and state organizations. These policies and algorithms were followed during the patient's care in the ED.  Some ED evaluations and interventions may be delayed as a result of limited staffing during and the pandemic.*   Note:  This document was prepared using  Dragon [employer] engineer and may include unintentional dictation errors.   Lucy Chris, PA 03/11/21 2219    Gilles Chiquito, MD 03/11/21 [phone]

## 2021-03-10 NOTE — Discharge Instructions (Signed)
Please take the steroid taper and muscle relaxer as prescribed.  You may also use Tylenol, 1000 mg up to 4 times daily as needed for pain.  Please return to the ER if any worsening, or otherwise follow-up with orthopedics.

## 2021-03-12 ENCOUNTER — Other Ambulatory Visit: Payer: Self-pay

## 2021-03-12 ENCOUNTER — Encounter: Payer: Self-pay | Admitting: Emergency Medicine

## 2021-03-12 ENCOUNTER — Emergency Department
Admission: EM | Admit: 2021-03-12 | Discharge: 2021-03-12 | Disposition: A | Discharge status: Home / Self Care | Payer: Self-pay | Attending: Emergency Medicine | Admitting: Emergency Medicine

## 2021-03-12 DIAGNOSIS — F17200 Nicotine dependence, unspecified, uncomplicated: Secondary | ICD-10-CM | POA: Insufficient documentation

## 2021-03-12 DIAGNOSIS — M25512 Pain in left shoulder: Secondary | ICD-10-CM | POA: Insufficient documentation

## 2021-03-12 DIAGNOSIS — M62838 Other muscle spasm: Secondary | ICD-10-CM | POA: Insufficient documentation

## 2021-03-12 MED ORDER — ACETAMINOPHEN 500 MG PO TABS
1000.0000 mg | ORAL_TABLET | Freq: Once | ORAL | Status: AC
  Administered 2021-03-12: 1000 mg via ORAL
  Filled 2021-03-12: qty 2

## 2021-03-12 MED ORDER — LIDOCAINE HCL (PF) 1 % IJ SOLN
10.0000 mL | Freq: Once | INTRAMUSCULAR | Status: AC
  Administered 2021-03-12: 10 mL via INTRADERMAL
  Filled 2021-03-12: qty 10

## 2021-03-12 MED ORDER — KETOROLAC TROMETHAMINE 30 MG/ML IJ SOLN
30.0000 mg | Freq: Once | INTRAMUSCULAR | Status: AC
  Administered 2021-03-12: 30 mg via INTRAMUSCULAR
  Filled 2021-03-12: qty 1

## 2021-03-12 NOTE — ED Triage Notes (Signed)
Pt seen on 4/25 and dx with with shoulder impingement syndrome, states was told to come back if symptoms no better, pt states pain continues to L shoulder at this time with no relief from medication.

## 2021-03-12 NOTE — ED Notes (Signed)
Pt verbalized understanding of d/c instructions at this time. Pt provided opportunity to ask questions at this time. Pt ambulatory to ED entrance, NAD noted, steady gait noted, RR even and unlabored at this time.   E-Sign unavailable at this time but pt gives verbal acknowledgement of ED discharge consent at this time.

## 2021-03-12 NOTE — ED Provider Notes (Signed)
[facility] Emergency Department Provider Note ____________________________________________   Event Date/Time   First MD Initiated Contact with Patient 03/12/21 1710     (approximate)  I have reviewed the triage vital signs and the nursing notes.  HISTORY  Chief Complaint Shoulder Pain   HPI Carolyn Baxter is a 50 y.o. femalewho presents to the ED for evaluation of shoulder pain.   Chart review indicates patient was seen 2 days ago for the same by one of our PAs.  Diagnosed with a shoulder impingement syndrome  Patient reports moving houses about 2 weeks ago, with associated lifting boxes and furniture.  She denies any injuries, falls. \  Patient ports increasing aching pain to her left shoulder that has been progressively worsening over the past 1 week.  She reports using Tylenol at home with minimal improvement.  Reports using Robaxin and prednisone taper prescribed 2 days ago with minimal relief.  She returns to the ED due to return precautions from this visit.  Patient tearful and reporting 8/10 intensity left shoulder pain.  Again denies any falls or trauma.  Fevers, chest pain substernally, shortness of breath, cough.   History reviewed. No pertinent past medical history.  There are no problems to display for this patient.   Past Surgical History:  Procedure Laterality Date  . TUBAL LIGATION      Prior to Admission medications   Medication Sig Start Date End Date Taking? Authorizing Provider  methocarbamol (ROBAXIN-750) 750 MG tablet Take 1 tablet (750 mg total) by mouth 4 (four) times daily as needed for up to 10 days for muscle spasms. 03/10/21 03/20/21  Lucy Chris, PA  predniSONE (DELTASONE) 10 MG tablet Take 6 tablets (60 mg total) by mouth daily for 1 day, THEN 5 tablets (50 mg total) daily for 1 day, THEN 4 tablets (40 mg total) daily for 1 day, THEN 3 tablets (30 mg total) daily for 1 day, THEN 2 tablets (20 mg total) daily for 1  day, THEN 1 tablet (10 mg total) daily for 1 day. 03/10/21 03/16/21  Lucy Chris, PA    Allergies Patient has no known allergies.  History reviewed. No pertinent family history.  Social History Social History   Tobacco Use  . Smoking status: Current Every Day Smoker  . Smokeless tobacco: Never Used  Substance Use Topics  . Alcohol use: No    Review of Systems  Constitutional: No fever/chills Eyes: No visual changes. ENT: No sore throat. Cardiovascular: Denies chest pain. Respiratory: Denies shortness of breath. Gastrointestinal: No abdominal pain.  No nausea, no vomiting.  No diarrhea.  No constipation. Genitourinary: Negative for dysuria. Musculoskeletal: Positive for atraumatic left-sided thoracic back pain, left shoulder pain radiating down her left arm. Skin: Negative for rash. Neurological: Negative for headaches, focal weakness or numbness.  ____________________________________________   PHYSICAL EXAM:  VITAL SIGNS: Vitals:   03/12/21 1707  BP: (!) 169/84  Pulse: 89  Resp: 20  Temp: 97.7 F (36.5 C)  SpO2: 94%     Constitutional: Alert and oriented.  Obese.  Pleasant and conversational. Eyes: Conjunctivae are normal. PERRL. EOMI. Head: Atraumatic. Nose: No congestion/rhinnorhea. Mouth/Throat: Mucous membranes are moist.  Oropharynx non-erythematous. Neck: No stridor. No cervical spine tenderness to palpation.  No meningismus. Cardiovascular: Normal rate, regular rhythm. Grossly normal heart sounds.  Good peripheral circulation. Respiratory: Normal respiratory effort.  No retractions. Lungs CTAB. Gastrointestinal: Soft , nondistended, nontender to palpation. No CVA tenderness. Musculoskeletal: No lower extremity tenderness nor  edema.  No joint effusions. No signs of acute trauma. Most tender along the left trapezius musculature, reproducing her radiating symptoms in her left arm.  No overlying skin changes or signs of trauma.  Palpable muscular cord  is present. Further tenderness to left-sided paraspinal cervical and thoracic musculature, similarly without overlying skin changes or signs of trauma. Tenderness along left deltoid and triceps musculature. Left arm without neurologic or vascular deficits. Neurologic:  Normal speech and language. No gross focal neurologic deficits are appreciated. No gait instability noted. Cranial nerves II through XII intact 5/5 strength and sensation in all 4 extremities Skin:  Skin is warm, dry and intact. No rash noted. Psychiatric: Mood and affect are normal. Speech and behavior are normal. ____________________________________________   PROCEDURES and INTERVENTIONS  Procedure(s) performed (including Critical Care):  Procedures   Trigger point injection After discussing risks versus benefits, patient is agreeable to proceed with trigger point injection for treatment of acute muscular spasm. Risks discussed include worsening pain, infection, neurovascular damage and pneumothorax.  Point of maximal tenderness was identified at left-sided paraspinal thoracic musculature, left-sided trapezius x2, left deltoid musculature and left trapezius musculature. Overlying skin was cleaned with alcohol swabs. 1% lidocaine without epinephrine was incrementally injected, after confirming negative drawback, into this musculature. Total of injected with 2 cc injected to each site. Well-tolerated without any apparent complications.   Medications  acetaminophen (TYLENOL) tablet 1,000 mg (1,000 mg Oral Given 03/12/21 1728)  ketorolac (TORADOL) 30 MG/ML injection 30 mg (30 mg Intramuscular Given 03/12/21 1728)  lidocaine (PF) (XYLOCAINE) 1 % injection 10 mL (10 mLs Intradermal Given 03/12/21 1728)    ____________________________________________   MDM / ED COURSE  Obese 50 year old woman presents with atraumatic left shoulder pain with evidence of muscular spasm as a likely source of symptoms.  No  evidence of meningismus, trauma to necessitate imaging, neurologic or vascular deficits.  Will provide anti-inflammatories, Tylenol and trigger point injection, as above.  Return precautions for the ED were discussed.   Clinical Course as of 03/13/21 0057  Wed Mar 12, 2021  1733 Trigger point injection performed.  Well-tolerated.  10 cc injected across 5 sites. [DS]  1755 Reassessed.  Patient reports relief of symptoms.  We discussed outpatient management and return precautions. [DS]    Clinical Course User Index [DS] Delton Prairie, MD    ____________________________________________   FINAL CLINICAL IMPRESSION(S) / ED DIAGNOSES  Final diagnoses:  Acute pain of left shoulder  Trapezius muscle spasm     ED Discharge Orders    None       Olanrewaju Osborn   Note:  This document was prepared using Dragon voice recognition software and may include unintentional dictation errors.   Delton Prairie, MD 03/13/21 [phone]

## 2021-03-12 NOTE — Discharge Instructions (Signed)
Use naproxen/Aleve for anti-inflammatory pain relief. Use up to 500mg  every 12 hours. Do not take more frequently than this. Do not use other NSAIDs (ibuprofen, Advil) while taking this medication. It is safe to take Tylenol with this.   Use Tylenol for pain and fevers.  Up to 1000 mg per dose, up to 4 times per day.  Do not take more than 4000 mg of Tylenol/acetaminophen within 24 hours..  You can also use the Robaxin muscle relaxer on top of both of these as needed for more severe pain.  Stop the prednisone, you do not need to take any steroids.  Return to the ED with any fevers with your symptoms, inability to use/feel your left arm

## 2023-01-04 ENCOUNTER — Other Ambulatory Visit: Payer: Self-pay

## 2023-01-04 ENCOUNTER — Emergency Department
Admission: EM | Admit: 2023-01-04 | Discharge: 2023-01-05 | Disposition: A | Discharge status: Home / Self Care | Payer: Self-pay | Attending: Emergency Medicine | Admitting: Emergency Medicine

## 2023-01-04 ENCOUNTER — Emergency Department: Payer: Self-pay

## 2023-01-04 ENCOUNTER — Encounter: Payer: Self-pay | Admitting: *Deleted

## 2023-01-04 DIAGNOSIS — U071 COVID-19: Secondary | ICD-10-CM | POA: Insufficient documentation

## 2023-01-04 LAB — COMPREHENSIVE METABOLIC PANEL
ALT: 33 U/L (ref 0–44)
AST: 23 U/L (ref 15–41)
Albumin: 3.3 g/dL — ABNORMAL LOW (ref 3.5–5.0)
Alkaline Phosphatase: 54 U/L (ref 38–126)
Anion gap: 7 (ref 5–15)
BUN: 10 mg/dL (ref 6–20)
CO2: 25 mmol/L (ref 22–32)
Calcium: 8.2 mg/dL — ABNORMAL LOW (ref 8.9–10.3)
Chloride: 107 mmol/L (ref 98–111)
Creatinine, Ser: 0.9 mg/dL (ref 0.44–1.00)
GFR, Estimated: >60 mL/min (ref >60)
Glucose, Bld: 134 mg/dL — ABNORMAL HIGH (ref 70–99)
Potassium: 3.5 mmol/L (ref 3.5–5.1)
Sodium: 139 mmol/L (ref 135–145)
Total Bilirubin: 0.6 mg/dL (ref 0.3–1.2)
Total Protein: 6.9 g/dL (ref 6.5–8.1)

## 2023-01-04 LAB — CBC WITH DIFFERENTIAL/PLATELET
Abs Immature Granulocytes: 0.02 10*3/uL (ref 0.00–0.07)
Basophils Absolute: 0.1 10*3/uL (ref 0.0–0.1)
Basophils Relative: 1 %
Eosinophils Absolute: 0.1 10*3/uL (ref 0.0–0.5)
Eosinophils Relative: 1 %
HCT: 40.1 % (ref 36.0–46.0)
Hemoglobin: 12.6 g/dL (ref 12.0–15.0)
Immature Granulocytes: 0 %
Lymphocytes Relative: 50 %
Lymphs Abs: 4.2 10*3/uL — ABNORMAL HIGH (ref 0.7–4.0)
MCH: 29.1 pg (ref 26.0–34.0)
MCHC: 31.4 g/dL (ref 30.0–36.0)
MCV: 92.6 fL (ref 80.0–100.0)
Monocytes Absolute: 0.9 10*3/uL (ref 0.1–1.0)
Monocytes Relative: 11 %
Neutro Abs: 3.1 10*3/uL (ref 1.7–7.7)
Neutrophils Relative %: 37 %
Platelets: 316 10*3/uL (ref 150–400)
RBC: 4.33 MIL/uL (ref 3.87–5.11)
RDW: 13.8 % (ref 11.5–15.5)
WBC: 8.3 10*3/uL (ref 4.0–10.5)
nRBC: 0 % (ref 0.0–0.2)

## 2023-01-04 LAB — TROPONIN I (HIGH SENSITIVITY): Troponin I (High Sensitivity): 4 ng/L (ref <18)

## 2023-01-04 NOTE — ED Triage Notes (Addendum)
Pt reports positive covid test today.  Pt also states she is coughing up blood tinged phlegm.  Pt has upper back pain.  Pt alert  speech clear.   No resp distress.

## 2023-01-05 ENCOUNTER — Emergency Department: Payer: Self-pay

## 2023-01-05 LAB — D-DIMER, QUANTITATIVE: D-Dimer, Quant: 0.54 ug/mL-FEU — ABNORMAL HIGH (ref 0.00–0.50)

## 2023-01-05 MED ORDER — IOHEXOL 350 MG/ML SOLN
100.0000 mL | Freq: Once | INTRAVENOUS | Status: AC | PRN
  Administered 2023-01-05: 100 mL via INTRAVENOUS

## 2023-01-05 MED ORDER — ACETAMINOPHEN 500 MG PO TABS
1000.0000 mg | ORAL_TABLET | Freq: Once | ORAL | Status: AC
  Administered 2023-01-05: 1000 mg via ORAL
  Filled 2023-01-05: qty 2

## 2023-01-05 NOTE — ED Provider Notes (Signed)
[facility] Provider Note    Event Date/Time   First MD Initiated Contact with Patient 01/04/23 2354     (approximate)   History   Covid Exposure   HPI  Carolyn Baxter is a 52 y.o. female with no significant past medical history presents with cough shortness of breath and hemoptysis.  Patient started feeling sick yesterday.  She took a COVID test at her work and this was positive.  She endorses shortness of breath cough fatigue fever and upper back/scapular pain.  She had 2 episodes of coughing up blood.  Shows me pictures and there is just a small streak of blood on her bed.  No large-volume hemoptysis.  She is denying anterior chest pain.  Pain in the scapula is worse with breathing and with movement.  She denies lower extremity swelling or pain.  No history of DVT/PE.     History reviewed. No pertinent past medical history.  There are no problems to display for this patient.    Physical Exam  Triage Vital Signs: ED Triage Vitals  Enc Vitals Group     BP 01/04/23 2215 127/85     Pulse Rate 01/04/23 2213 85     Resp 01/04/23 2213 20     Temp 01/04/23 2213 98.1 F (36.7 C)     Temp Source 01/04/23 2213 Oral     SpO2 01/04/23 2213 96 %     Weight 01/04/23 2210 240 lb (108.9 kg)     Height 01/04/23 2210 5' 4"$  (1.626 m)     Head Circumference --      Peak Flow --      Pain Score 01/04/23 2210 8     Pain Loc --      Pain Edu? --      Excl. in [place]? --     Most recent vital signs: Vitals:   01/05/23 0100 01/05/23 0122  BP: 122/80   Pulse:  (!) 59  Resp:  16  Temp:    SpO2:  100%     General: Awake, no distress.  CV:  Good peripheral perfusion.  No signs of DVT no leg swelling or edema Resp:  Normal effort.  Lung sounds are clear Abd:  No distention.  Neuro:             Awake, Alert, Oriented x 3  Other:  Tenderness to palpation in the right mid thoracic region, no midline tenderness   ED Results / Procedures / Treatments   Labs (all labs ordered are listed, but only abnormal results are displayed) Labs Reviewed  CBC WITH DIFFERENTIAL/PLATELET - Abnormal; Notable for the following components:      Result Value   Lymphs Abs 4.2 (*)    All other components within normal limits  COMPREHENSIVE METABOLIC PANEL - Abnormal; Notable for the following components:   Glucose, Bld 134 (*)    Calcium 8.2 (*)    Albumin 3.3 (*)    All other components within normal limits  D-DIMER, QUANTITATIVE - Abnormal; Notable for the following components:   D-Dimer, Quant 0.54 (*)    All other components within normal limits  TROPONIN I (HIGH SENSITIVITY)     EKG  EKG reviewed interpreted myself shows sinus rhythm with right axis deviation S1Q3T3, T wave inversion in lead III and aVF   RADIOLOGY I reviewed and interpreted the CXR which does not show any acute cardiopulmonary process    PROCEDURES:  Critical Care performed: No  Procedures  The patient is on the cardiac monitor to evaluate for evidence of arrhythmia and/or significant heart rate changes.   MEDICATIONS ORDERED IN ED: Medications  acetaminophen (TYLENOL) tablet 1,000 mg (1,000 mg Oral Given 01/05/23 0123)  iohexol (OMNIPAQUE) 350 MG/ML injection 100 mL (100 mLs Intravenous Contrast Given 01/05/23 0319)     IMPRESSION / MDM / ASSESSMENT AND PLAN / ED COURSE  I reviewed the triage vital signs and the nursing notes.                              Patient's presentation is most consistent with acute presentation with potential threat to life or bodily function.  Differential diagnosis includes, but is not limited to, bronchitis, pulmonary embolism, pneumonia, less likely diffuse alveolar hemorrhage, vasculitis Patient is a 52 year old female presents with cough back pain and hemoptysis in the setting of positive COVID test.  She started feeling sick yesterday took a test for COVID at her work which was positive.  She has had a mild cough and some  shortness of breath as well as upper back pain that is pleuritic.  Had 2 episodes of small scant hemoptysis today.  On arrival vital signs are reassuring she is not tachycardic or hypoxic.  EKG does show an S1Q3T3 although this was present which is less pronounced on prior EKG.  Chest x-ray is clear.  Patient overall looks well lungs are clear no objective signs of DVT.  She has some periscapular tenderness on the right that is reproducible with palpation.  Overall I am suspicious that this is likely a bronchitis in the setting of COVID but pulmonary embolism certainly on the differential.  Will screen with D-dimer as patient is otherwise low risk.  D-dimer slightly elevated at 0.54 so CT angio obtained.  This is negative for PE.  Discussed findings with the patient.  Suspect a mild bronchitis in the setting of COVID.  We discussed return for any large-volume hemoptysis.  She remains stable with no hypoxia and can be discharged.       FINAL CLINICAL IMPRESSION(S) / ED DIAGNOSES   Final diagnoses:  [id number]     Rx / DC Orders   ED Discharge Orders     None        Note:  This document was prepared using Dragon voice recognition software and may include unintentional dictation errors.   Rada Hay, MD 01/05/23 0400

## 2023-01-05 NOTE — Discharge Instructions (Signed)
Your CAT scan did not show any evidence of blood clot.  You likely have bronchitis caused by the COVID.  Rest and take Tylenol Motrin for pain.  If you have large amounts of coughing up blood return to emergency department.

## 2023-01-05 NOTE — Progress Notes (Signed)
PT had approximately 69m omnipaque 350 extravasate into right arm. 18g started by ED staff failed under the CT power injector. Extravasation orders placed by CT staff and PT was taken back to ED. Dr. MStarleen Blueand RN CMurtis Sinknotified of incident via Epic chat.

## 2023-03-08 NOTE — Progress Notes (Deleted)
    New patient visit   Patient: Carolyn Baxter   DOB: 10/20/1971   53 y.o. Female  MRN: [id number] Visit Date: 03/09/2023  Today's healthcare provider: Jacky Kindle, FNP   No chief complaint on file.  Subjective    Carolyn Baxter is a 52 y.o. female who presents today as a new patient to establish care.  HPI  ***  No past medical history on file. Past Surgical History:  Procedure Laterality Date   TUBAL LIGATION     No family status information on file.   No family history on file. Social History   Socioeconomic History   Marital status: Single    Spouse name: Not on file   Number of children: Not on file   Years of education: Not on file   Highest education level: Not on file  Occupational History   Not on file  Tobacco Use   Smoking status: Every Day   Smokeless tobacco: Never  Substance and Sexual Activity   Alcohol use: No   Drug use: Not on file   Sexual activity: Not on file  Other Topics Concern   Not on file  Social History Narrative   Not on file   Social Determinants of Health   Financial Resource Strain: Not on file  Food Insecurity: Not on file  Transportation Needs: Not on file  Physical Activity: Not on file  Stress: Not on file  Social Connections: Not on file   No outpatient medications prior to visit.   No facility-administered medications prior to visit.   No Known Allergies   There is no immunization history on file for this patient.  Health Maintenance  Topic Date Due   COVID-19 Vaccine (1) Never done   HIV Screening  Never done   Hepatitis C Screening  Never done   DTaP/Tdap/Td (1 - Tdap) Never done   PAP SMEAR-Modifier  Never done   COLONOSCOPY (Pts 45-35yrs Insurance coverage will need to be confirmed)  Never done   MAMMOGRAM  Never done   Zoster Vaccines- Shingrix (1 of 2) Never done   INFLUENZA VACCINE  06/17/2023   HPV VACCINES  Aged Out    Patient Care Team: Pcp, No as PCP - General  Review of  Systems  {Labs  Heme  Chem  Endocrine  Serology  Results Review (optional):23779}   Objective    LMP 01/02/2015 (Approximate)  {Show previous vital signs (optional):23777}  Physical Exam ***  Depression Screen     No data to display         No results found for any visits on 03/09/23.  Assessment & Plan     ***  No follow-ups on file.     {provider attestation***:1}   Jacky Kindle, FNP  [facility] [phone] (phone) [phone] (fax)  [facility]

## 2023-03-09 ENCOUNTER — Ambulatory Visit: Payer: BLUE CROSS/BLUE SHIELD | Admitting: Family Medicine

## 2023-07-23 ENCOUNTER — Other Ambulatory Visit: Payer: Self-pay

## 2023-12-02 ENCOUNTER — Ambulatory Visit: Payer: Medicaid Other | Admitting: Cardiology

## 2024-11-22 ENCOUNTER — Ambulatory Visit: Admitting: Nurse Practitioner

## 2024-11-22 ENCOUNTER — Encounter: Payer: Self-pay | Admitting: Nurse Practitioner

## 2024-11-22 VITALS — BP 138/88 | HR 72 | Temp 98.0°F | Ht 64.0 in | Wt 252.0 lb

## 2024-11-22 DIAGNOSIS — Z23 Encounter for immunization: Secondary | ICD-10-CM

## 2024-11-22 DIAGNOSIS — Z131 Encounter for screening for diabetes mellitus: Secondary | ICD-10-CM

## 2024-11-22 DIAGNOSIS — Z1159 Encounter for screening for other viral diseases: Secondary | ICD-10-CM

## 2024-11-22 DIAGNOSIS — Z13220 Encounter for screening for lipoid disorders: Secondary | ICD-10-CM

## 2024-11-22 DIAGNOSIS — E66813 Obesity, class 3: Secondary | ICD-10-CM | POA: Diagnosis not present

## 2024-11-22 DIAGNOSIS — Z114 Encounter for screening for human immunodeficiency virus [HIV]: Secondary | ICD-10-CM

## 2024-11-22 DIAGNOSIS — Z6841 Body Mass Index (BMI) 40.0 and over, adult: Secondary | ICD-10-CM

## 2024-11-22 DIAGNOSIS — Z1211 Encounter for screening for malignant neoplasm of colon: Secondary | ICD-10-CM

## 2024-11-22 DIAGNOSIS — Z1231 Encounter for screening mammogram for malignant neoplasm of breast: Secondary | ICD-10-CM

## 2024-11-22 DIAGNOSIS — Z130 Encounter for screening for diseases of the blood and blood-forming organs and certain disorders involving the immune mechanism: Secondary | ICD-10-CM

## 2024-11-22 DIAGNOSIS — Z7689 Persons encountering health services in other specified circumstances: Secondary | ICD-10-CM

## 2024-11-22 MED ORDER — WEGOVY 0.25 MG/0.5ML ~~LOC~~ SOAJ: 0.2500 mg | SUBCUTANEOUS | 0 refills | Status: DC

## 2024-11-22 NOTE — Progress Notes (Signed)
 "  BP 138/88   Pulse 72   Temp 98 F (36.7 C)   Ht 5' 4 (1.626 m)   Wt 252 lb (114.3 kg)   LMP 01/02/2015   SpO2 95%   BMI 43.26 kg/m    Subjective:    Patient ID: Carolyn Baxter, female    DOB: 11/11/1971, 54 y.o.   MRN: [id number]  HPI: Carolyn Baxter is a 54 y.o. female  Chief Complaint  Patient presents with   Establish Care    Pt would like to discuss her weight and feeling bloated.    Discussed the use of AI scribe software for clinical note transcription with the patient, who gave verbal consent to proceed.  History of Present Illness [name] is a 54 year old female who presents to establish care.  Obesity and weight management - Current weight is 252 pounds with a BMI of 43.26. - Waist circumference is 51 inches. - Diet and exercise have been insufficient for weight loss. - Interested in weight loss medications, specifically Wegovy , but uncertain about insurance coverage.  Gastrointestinal symptoms - Intermittent bloating with sensation of abdominal expansion and contraction. - Symptoms potentially related to dietary intake. - Episodes cause significant discomfort and are suspected to be triggered by specific foods. - No history of pancreatitis.  Menstrual irregularity and menopausal status - No menstrual cycle for approximately three years. - Experienced a menstrual cycle two months ago, suggesting premenopausal status.  Preventive health screenings - Never undergone mammogram or colonoscopy. - Last Pap smear was a long time ago.  Fatigue - Experiences fatigue, attributed to weight loss efforts and general lifestyle.    Flowsheet Row Office Visit from 11/22/2024 in [facility]  1 51 inches    Wt Readings from Last 3 Encounters:  11/22/24 252 lb (114.3 kg)  01/04/23 240 lb (108.9 kg)  03/12/21 169 lb 15.6 oz (77.1 kg)   Body mass index is 43.26 kg/m.  - Encourage continuation of lifestyle modifications, including  dietary management and regular exercise. -continue to increase physical activity, getting at least 150 min of physical activity a week.  Work on including runner, broadcasting/film/video 2 days a week.  - continue eating at a calorie deficit 1600-1800 cal a day, eating a well balanced diet with whole foods, avoiding processed foods.   Patient is motivated to continue working on lifestyle modification.       11/22/2024    3:09 PM  Depression screen PHQ 2/9  Decreased Interest 0  Down, Depressed, Hopeless 0  PHQ - 2 Score 0  Altered sleeping 0  Tired, decreased energy 0  Change in appetite 0  Feeling bad or failure about yourself  0  Trouble concentrating 0  Moving slowly or fidgety/restless 0  Suicidal thoughts 0  PHQ-9 Score 0  Difficult doing work/chores Not difficult at all    Relevant past medical, surgical, family and social history reviewed and updated as indicated. Interim medical history since our last visit reviewed. Allergies and medications reviewed and updated.  Review of Systems  Constitutional: Negative for fever or weight change.  Respiratory: Negative for cough and shortness of breath.   Cardiovascular: Negative for chest pain or palpitations.  Gastrointestinal: Negative for abdominal pain, no bowel changes.  Musculoskeletal: Negative for gait problem or joint swelling.  Skin: Negative for rash.  Neurological: Negative for dizziness or headache.  No other specific complaints in a complete review of systems (except as listed in HPI above).  Objective:      BP 138/88   Pulse 72   Temp 98 F (36.7 C)   Ht 5' 4 (1.626 m)   Wt 252 lb (114.3 kg)   LMP 01/02/2015   SpO2 95%   BMI 43.26 kg/m    Wt Readings from Last 3 Encounters:  11/22/24 252 lb (114.3 kg)  01/04/23 240 lb (108.9 kg)  03/12/21 169 lb 15.6 oz (77.1 kg)    Physical Exam VITALS: BP- 138/88 MEASUREMENTS: Weight- 252, BMI- 43.26. GENERAL: Alert, cooperative, well developed, no acute  distress. HEENT: Normocephalic, normal oropharynx, moist mucous membranes. CHEST: Clear to auscultation bilaterally, no wheezes, rhonchi, or crackles. CARDIOVASCULAR: Normal heart rate and rhythm, S1 and S2 normal without murmurs. ABDOMEN: Soft, non-tender, non-distended, without organomegaly, normal bowel sounds. EXTREMITIES: No cyanosis or edema. NEUROLOGICAL: Cranial nerves grossly intact, moves all extremities without gross motor or sensory deficit.  Results for orders placed or performed during the hospital encounter of 01/04/23  CBC with Differential   Collection Time: 01/04/23 10:16 PM  Result Value Ref Range   WBC 8.3 4.0 - 10.5 K/uL   RBC 4.33 3.87 - 5.11 MIL/uL   Hemoglobin 12.6 12.0 - 15.0 g/dL   HCT 59.8 63.9 - 53.9 %   MCV 92.6 80.0 - 100.0 fL   MCH 29.1 26.0 - 34.0 pg   MCHC 31.4 30.0 - 36.0 g/dL   RDW 86.1 88.4 - 84.4 %   Platelets 316 150 - 400 K/uL   nRBC 0.0 0.0 - 0.2 %   Neutrophils Relative % 37 %   Neutro Abs 3.1 1.7 - 7.7 K/uL   Lymphocytes Relative 50 %   Lymphs Abs 4.2 (H) 0.7 - 4.0 K/uL   Monocytes Relative 11 %   Monocytes Absolute 0.9 0.1 - 1.0 K/uL   Eosinophils Relative 1 %   Eosinophils Absolute 0.1 0.0 - 0.5 K/uL   Basophils Relative 1 %   Basophils Absolute 0.1 0.0 - 0.1 K/uL   Immature Granulocytes 0 %   Abs Immature Granulocytes 0.02 0.00 - 0.07 K/uL  Comprehensive metabolic panel   Collection Time: 01/04/23 10:16 PM  Result Value Ref Range   Sodium 139 135 - 145 mmol/L   Potassium 3.5 3.5 - 5.1 mmol/L   Chloride 107 98 - 111 mmol/L   CO2 25 22 - 32 mmol/L   Glucose, Bld 134 (H) 70 - 99 mg/dL   BUN 10 6 - 20 mg/dL   Creatinine, Ser 9.09 0.44 - 1.00 mg/dL   Calcium 8.2 (L) 8.9 - 10.3 mg/dL   Total Protein 6.9 6.5 - 8.1 g/dL   Albumin 3.3 (L) 3.5 - 5.0 g/dL   AST 23 15 - 41 U/L   ALT 33 0 - 44 U/L   Alkaline Phosphatase 54 38 - 126 U/L   Total Bilirubin 0.6 0.3 - 1.2 mg/dL   GFR, Estimated >39 >39 mL/min   Anion gap 7 5 - 15   D-dimer, quantitative   Collection Time: 01/04/23 10:16 PM  Result Value Ref Range   D-Dimer, Quant 0.54 (H) 0.00 - 0.50 ug/mL-FEU  Troponin I (High Sensitivity)   Collection Time: 01/04/23 10:16 PM  Result Value Ref Range   Troponin I (High Sensitivity) 4 <18 ng/L          Assessment & Plan:   Problem List Items Addressed This Visit   None Visit Diagnoses       Class 3 severe obesity due to excess calories without serious  comorbidity with body mass index (BMI) of 40.0 to 44.9 in adult [facility])    -  Primary   Relevant Medications   WEGOVY  0.25 MG/0.5ML SOAJ SQ injection   Other Relevant Orders   TSH     Immunization due       Relevant Orders   Flu vaccine trivalent PF, 6mos and older(Flulaval,Afluria,Fluarix,Fluzone) (Completed)   Pneumococcal conjugate vaccine 20-valent (Prevnar 20) (Completed)   Tdap vaccine greater than or equal to 7yo IM (Completed)   Heplisav-B  (HepB-CPG) Vaccine (Completed)     Screening for colon cancer       Relevant Orders   Ambulatory referral to Gastroenterology     Screening for deficiency anemia       Relevant Orders   CBC with Differential/Platelet     Screening for cholesterol level       Relevant Orders   Lipid panel     Screening for diabetes mellitus       Relevant Orders   Comprehensive metabolic panel with GFR   Hemoglobin A1c     Encounter for hepatitis C screening test for low risk patient       Relevant Orders   Hepatitis C antibody     Screening for HIV without presence of risk factors       Relevant Orders   HIV Antibody (routine testing w rflx)     Encounter to establish care         Encounter for screening mammogram for malignant neoplasm of breast       Relevant Orders   MM 3D SCREENING MAMMOGRAM BILATERAL BREAST        Assessment and Plan Assessment & Plan Woman's Wellness Visit Routine wellness visit to establish care. Due for mammogram, colonoscopy, and Pap smear. Immunizations for flu, pneumonia, tetanus,  and hepatitis B are due. - Ordered mammogram - Ordered colonoscopy - Scheduled Pap smear for next visit - Administered flu, pneumonia, tetanus, and hepatitis B vaccines  Class 3 obesity BMI of 43.26. Discussed lifestyle modifications including diet and exercise. Consideration of weight loss medication (Wegovy ) pending insurance approval. Discussed potential side effects of Wegovy  including nausea, vomiting, abdominal pain, constipation, and fatigue. Emphasized that medication is a tool, not a cure-all, and lifestyle changes are essential. Discussed the option of oral Wegovy  if injection is not covered by insurance. - Recommended lifestyle modifications: balanced diet with portion control, 1600-1800 calories per day, 150 minutes of physical activity per week, and strength training - Ordered blood work including CBC, CMP, lipid panel, hemoglobin A1c, hep C, HIV, and TSH - Submitted prior authorization for Wegovy  - Provided printout with weight loss information - Scheduled follow-up in 3 months if Wegovy  is approved  Premenopausal state Confirmed by irregular menstrual cycles. Discussed challenges of weight loss during this phase and potential benefits of fasting. - Consider skipping breakfast for weight loss - Provided printout with dietary recommendations  Abdominal bloating Intermittent abdominal bloating possibly related to diet. No immediate concerns but will monitor with blood work to assess liver and kidney function. - Ordered blood work to assess liver and kidney function        Follow up plan: Return in about 3 months (around 02/20/2025) for follow up, with pap. "

## 2024-11-22 NOTE — Patient Instructions (Signed)
 Healthy Weight Loss Guide ?? Weight Loss Goal Calories aim for 1600-1800 a day - Aim for 1-2 pounds per week - Target: 5-10% of your starting body weight over 3-6 months ??? Nutrition Tips - Eat 3 meals per day and avoid skipping meals - Fill half your plate with vegetables, a quarter with protein, a quarter with whole grains - Choose lean proteins: chicken, fish, eggs, tofu, beans - Limit: - Sugary drinks (soda, sweet tea, juice) - Fried foods and fast food - Processed snacks (chips, candy, cookies) - Drink at least 64 oz of water per day - Practice portion control and mindful eating ???? Lifestyle Habits - Track what you eat (apps like MyFitnessPal, Lose It!, or a paper log) - Get 7-9 hours of sleep per night - Manage stress (meditation, breathing exercises, counseling if needed) - Limit alcohol (empty calories and may increase hunger) ???? Exercise Recommendations - Goal: 150 minutes per week of moderate activity (e.g., brisk walking, cycling) - Start with 10-15 minutes/day and build up gradually - Add 2 days per week of strength training (light weights, resistance bands, or bodyweight) ?? Remember: Progress > Perfection Small changes every day add up. Don't give up! - Avoid high-fat or greasy foods to reduce nausea - Focus on protein at each meal to preserve muscle mass - Stay well hydrated (at least 64 oz water per day) - Limit sugar and processed carbohydrates ?? Managing Side Effects if on weight loss medication - Eat slowly and stop eating when you feel full - Use anti-nausea strategies: ginger tea, peppermint, crackers - Talk to your provider about adjusting the dose if needed - Stool softeners or fiber supplements can help with constipation ?? Staying on Track - Track weight and non-scale victories (energy, clothing fit, labs) - Follow up with your provider regularly - Don't stop medication without medical guidance - Combine medication with healthy habits for best  results ?? Remember Weight loss medications are a tool, not a shortcut. Healthy habits matter. Be patient and consistent--small changes lead to big results.

## 2024-11-23 ENCOUNTER — Other Ambulatory Visit: Payer: Self-pay | Admitting: Nurse Practitioner

## 2024-11-23 ENCOUNTER — Other Ambulatory Visit: Payer: Self-pay

## 2024-11-23 ENCOUNTER — Telehealth: Payer: Self-pay | Admitting: Pharmacy Technician

## 2024-11-23 ENCOUNTER — Other Ambulatory Visit (HOSPITAL_COMMUNITY): Payer: Self-pay

## 2024-11-23 ENCOUNTER — Telehealth: Payer: Self-pay

## 2024-11-23 ENCOUNTER — Ambulatory Visit: Payer: Self-pay | Admitting: Nurse Practitioner

## 2024-11-23 DIAGNOSIS — E1165 Type 2 diabetes mellitus with hyperglycemia: Secondary | ICD-10-CM

## 2024-11-23 DIAGNOSIS — Z1211 Encounter for screening for malignant neoplasm of colon: Secondary | ICD-10-CM

## 2024-11-23 DIAGNOSIS — E785 Hyperlipidemia, unspecified: Secondary | ICD-10-CM | POA: Insufficient documentation

## 2024-11-23 LAB — HIV ANTIBODY (ROUTINE TESTING W REFLEX)
HIV 1&2 Ab, 4th Generation: NONREACTIVE
HIV FINAL INTERPRETATION: NEGATIVE

## 2024-11-23 LAB — CBC WITH DIFFERENTIAL/PLATELET
Absolute Lymphocytes: 3019 {cells}/uL (ref 850–3900)
Absolute Monocytes: 601 {cells}/uL (ref 200–950)
Basophils Absolute: 47 {cells}/uL (ref 0–200)
Basophils Relative: 0.6 %
Eosinophils Absolute: 70 {cells}/uL (ref 15–500)
Eosinophils Relative: 0.9 %
HCT: 41.7 % (ref 35.9–46.0)
Hemoglobin: 13.3 g/dL (ref 11.7–15.5)
MCH: 29.2 pg (ref 27.0–33.0)
MCHC: 31.9 g/dL (ref 31.6–35.4)
MCV: 91.4 fL (ref 81.4–101.7)
MPV: 9.9 fL (ref 7.5–12.5)
Monocytes Relative: 7.7 %
Neutro Abs: 4064 {cells}/uL (ref 1500–7800)
Neutrophils Relative %: 52.1 %
Platelets: 384 Thousand/uL (ref 140–400)
RBC: 4.56 Million/uL (ref 3.80–5.10)
RDW: 12.9 % (ref 11.0–15.0)
Total Lymphocyte: 38.7 %
WBC: 7.8 Thousand/uL (ref 3.8–10.8)

## 2024-11-23 LAB — HEMOGLOBIN A1C
Hgb A1c MFr Bld: 6.9 % — ABNORMAL HIGH
Mean Plasma Glucose: 151 mg/dL
eAG (mmol/L): 8.4 mmol/L

## 2024-11-23 LAB — TSH: TSH: 1.47 m[IU]/L

## 2024-11-23 LAB — COMPREHENSIVE METABOLIC PANEL WITH GFR
AG Ratio: 1.4 (calc) (ref 1.0–2.5)
ALT: 30 U/L — ABNORMAL HIGH (ref 6–29)
AST: 23 U/L (ref 10–35)
Albumin: 4.2 g/dL (ref 3.6–5.1)
Alkaline phosphatase (APISO): 69 U/L (ref 37–153)
BUN: 8 mg/dL (ref 7–25)
CO2: 26 mmol/L (ref 20–32)
Calcium: 8.9 mg/dL (ref 8.6–10.4)
Chloride: 105 mmol/L (ref 98–110)
Creat: 0.67 mg/dL (ref 0.50–1.03)
Globulin: 3.1 g/dL (ref 1.9–3.7)
Glucose, Bld: 115 mg/dL — ABNORMAL HIGH (ref 65–99)
Potassium: 4.5 mmol/L (ref 3.5–5.3)
Sodium: 138 mmol/L (ref 135–146)
Total Bilirubin: 0.6 mg/dL (ref 0.2–1.2)
Total Protein: 7.3 g/dL (ref 6.1–8.1)
eGFR: 104 mL/min/1.73m2

## 2024-11-23 LAB — LIPID PANEL
Cholesterol: 223 mg/dL — ABNORMAL HIGH
HDL: 47 mg/dL — ABNORMAL LOW
LDL Cholesterol (Calc): 152 mg/dL — ABNORMAL HIGH
Non-HDL Cholesterol (Calc): 176 mg/dL — ABNORMAL HIGH
Total CHOL/HDL Ratio: 4.7 (calc)
Triglycerides: 121 mg/dL

## 2024-11-23 LAB — HEPATITIS C ANTIBODY: Hepatitis C Ab: NONREACTIVE

## 2024-11-23 MED ORDER — LANCET DEVICE MISC: 1.0000 | Freq: Three times a day (TID) | 0 refills | Status: AC

## 2024-11-23 MED ORDER — LANCETS MISC: 1.0000 | 0 refills | Status: AC

## 2024-11-23 MED ORDER — BLOOD GLUCOSE MONITORING SUPPL DEVI: 1.0000 | Freq: Three times a day (TID) | 0 refills | Status: AC

## 2024-11-23 MED ORDER — NA SULFATE-K SULFATE-MG SULF 17.5-3.13-1.6 GM/177ML PO SOLN: 354.0000 mL | Freq: Once | ORAL | 0 refills | Status: AC

## 2024-11-23 MED ORDER — ROSUVASTATIN CALCIUM 10 MG PO TABS: 10.0000 mg | ORAL_TABLET | Freq: Every day | ORAL | 1 refills | Status: AC

## 2024-11-23 MED ORDER — OZEMPIC (0.25 OR 0.5 MG/DOSE) 2 MG/3ML ~~LOC~~ SOPN: 0.2500 mg | PEN_INJECTOR | SUBCUTANEOUS | 0 refills | Status: AC

## 2024-11-23 MED ORDER — METFORMIN HCL ER 500 MG PO TB24: 500.0000 mg | ORAL_TABLET | Freq: Every day | ORAL | 0 refills | Status: AC

## 2024-11-23 MED ORDER — BLOOD GLUCOSE TEST VI STRP: 1.0000 | ORAL_STRIP | Freq: Three times a day (TID) | 0 refills | Status: AC

## 2024-11-23 NOTE — Telephone Encounter (Signed)
 Pharmacy Patient Advocate Encounter   Received notification from CoverMyMeds that prior authorization for Ozempic  0.25 mg/dose is required/requested.   Insurance verification completed.   The patient is insured through [employer].   Per test claim: Patient must have a trial/failure of metformin  before we can submit a PA

## 2024-11-23 NOTE — Telephone Encounter (Signed)
 PA request cancelled due to change in therapy

## 2024-11-23 NOTE — Telephone Encounter (Signed)
 Gastroenterology Pre-Procedure Review  Request Date: 02/27/2025 Requesting Physician: Dr. Jinny  PATIENT REVIEW QUESTIONS: The patient responded to the following health history questions as indicated:    1. Are you having any GI issues? no 2. Do you have a personal history of Polyps? no 3. Do you have a family history of Colon Cancer or Polyps? no 4. Diabetes Mellitus? yes (Metformin ) 5. Joint replacements in the past 12 months?no 6. Major health problems in the past 3 months?yes (Diabetes recently diagnosed) 7. Any artificial heart valves, MVP, or defibrillator?no    MEDICATIONS & ALLERGIES:    Patient reports the following regarding taking any anticoagulation/antiplatelet therapy:   Plavix, Coumadin, Eliquis, Xarelto, Lovenox, Pradaxa, Brilinta, or Effient? no Aspirin? no  Patient confirms/reports the following medications:  Current Outpatient Medications  Medication Sig Dispense Refill   Blood Glucose Monitoring Suppl DEVI 1 each by Does not apply route in the morning, at noon, and at bedtime. May substitute to any manufacturer covered by patient's insurance. 1 each 0   Glucose Blood (BLOOD GLUCOSE TEST STRIPS) STRP 1 each by In Vitro route in the morning, at noon, and at bedtime. May substitute to any manufacturer covered by patient's insurance. 100 strip 0   Lancet Device MISC 1 each by Does not apply route in the morning, at noon, and at bedtime. May substitute to any manufacturer covered by patient's insurance. 1 each 0   Lancets MISC 1 each by Does not apply route as directed. Dispense based on patient and insurance preference. Use up to four times daily as directed. (FOR ICD-10 E10.9, E11.9). 100 each 0   rosuvastatin  (CRESTOR ) 10 MG tablet Take 1 tablet (10 mg total) by mouth daily. 90 tablet 1   Semaglutide ,0.25 or 0.5MG /DOS, (OZEMPIC , 0.25 OR 0.5 MG/DOSE,) 2 MG/3ML SOPN Inject 0.25 mg into the skin once a week. 3 mL 0   No current facility-administered medications for this  visit.    Patient confirms/reports the following allergies:  Allergies[1]  No orders of the defined types were placed in this encounter.   AUTHORIZATION INFORMATION Primary Insurance: 1D#: Group #:  Secondary Insurance: 1D#: Group #:  SCHEDULE INFORMATION: Date: 02/27/2025 Time: Location: [facility] Dr. Jinny     [1]  Allergies Allergen Reactions   Penicillins     No reaction

## 2024-11-23 NOTE — Telephone Encounter (Signed)
 Pharmacy Patient Advocate Encounter   Received notification from CoverMyMeds that prior authorization for Wegovy  0.25MG /0.5ML auto-injectors is required/requested.   Insurance verification completed.   The patient is insured through [facility] MEDICAID.   Per test claim: PA required; PA started via CoverMyMeds. KEY [id number] . Waiting for clinical questions to populate.

## 2024-11-24 ENCOUNTER — Other Ambulatory Visit (HOSPITAL_COMMUNITY): Payer: Self-pay

## 2024-11-30 ENCOUNTER — Encounter: Payer: Self-pay | Admitting: Nurse Practitioner

## 2024-12-12 ENCOUNTER — Ambulatory Visit
Admission: RE | Admit: 2024-12-12 | Discharge: 2024-12-12 | Disposition: A | Discharge status: Home / Self Care | Source: Ambulatory Visit | Attending: Nurse Practitioner | Admitting: Nurse Practitioner

## 2024-12-12 DIAGNOSIS — Z1231 Encounter for screening mammogram for malignant neoplasm of breast: Secondary | ICD-10-CM | POA: Diagnosis present

## 2025-01-02 ENCOUNTER — Ambulatory Visit: Payer: Self-pay | Admitting: Podiatry

## 2025-02-21 ENCOUNTER — Ambulatory Visit: Admitting: Nurse Practitioner

## 2025-02-27 ENCOUNTER — Ambulatory Visit: Admit: 2025-02-27 | Admitting: Gastroenterology

## 2025-02-27 SURGERY — COLONOSCOPY
Anesthesia: General
